# Patient Record
Sex: Female | Born: 1965 | Race: White | Hispanic: No | Marital: Married | Smoking: Former smoker
Health system: Southern US, Community
[De-identification: ages and names within clinical notes are randomized; demographics above are authoritative.]

## PROBLEM LIST (undated history)

## (undated) DIAGNOSIS — Z9109 Other allergy status, other than to drugs and biological substances: Secondary | ICD-10-CM

## (undated) DIAGNOSIS — J40 Bronchitis, not specified as acute or chronic: Secondary | ICD-10-CM

## (undated) DIAGNOSIS — T7840XA Allergy, unspecified, initial encounter: Secondary | ICD-10-CM

## (undated) DIAGNOSIS — D689 Coagulation defect, unspecified: Secondary | ICD-10-CM

## (undated) DIAGNOSIS — C801 Malignant (primary) neoplasm, unspecified: Secondary | ICD-10-CM

## (undated) DIAGNOSIS — M1990 Unspecified osteoarthritis, unspecified site: Secondary | ICD-10-CM

## (undated) HISTORY — PX: OTHER SURGICAL HISTORY: SHX169

## (undated) HISTORY — PX: WISDOM TOOTH EXTRACTION: SHX21

## (undated) HISTORY — DX: Unspecified osteoarthritis, unspecified site: M19.90

## (undated) HISTORY — PX: COLPOSCOPY: SHX161

## (undated) HISTORY — DX: Allergy, unspecified, initial encounter: T78.40XA

## (undated) HISTORY — PX: JOINT REPLACEMENT: SHX530

## (undated) HISTORY — DX: Coagulation defect, unspecified: D68.9

---

## 2004-09-01 ENCOUNTER — Encounter: Admission: RE | Admit: 2004-09-01 | Discharge: 2004-09-01 | Payer: Self-pay | Admitting: Occupational Medicine

## 2007-12-22 ENCOUNTER — Other Ambulatory Visit: Admission: RE | Admit: 2007-12-22 | Discharge: 2007-12-22 | Payer: Self-pay | Admitting: Obstetrics and Gynecology

## 2010-06-04 HISTORY — PX: ABDOMINAL HYSTERECTOMY: SHX81

## 2011-02-26 ENCOUNTER — Other Ambulatory Visit (HOSPITAL_COMMUNITY)
Admission: RE | Admit: 2011-02-26 | Discharge: 2011-02-26 | Disposition: A | Discharge status: Home / Self Care | Payer: Managed Care, Other (non HMO) | Source: Ambulatory Visit | Attending: Obstetrics and Gynecology | Admitting: Obstetrics and Gynecology

## 2011-02-26 DIAGNOSIS — R87810 Cervical high risk human papillomavirus (HPV) DNA test positive: Secondary | ICD-10-CM | POA: Insufficient documentation

## 2011-02-26 DIAGNOSIS — Z01419 Encounter for gynecological examination (general) (routine) without abnormal findings: Secondary | ICD-10-CM | POA: Insufficient documentation

## 2011-05-07 ENCOUNTER — Encounter (HOSPITAL_COMMUNITY): Payer: Self-pay | Admitting: *Deleted

## 2011-05-08 ENCOUNTER — Encounter (HOSPITAL_COMMUNITY): Payer: Self-pay | Admitting: Pharmacist

## 2011-05-13 ENCOUNTER — Other Ambulatory Visit: Payer: Self-pay | Admitting: Obstetrics and Gynecology

## 2011-05-14 ENCOUNTER — Ambulatory Visit (HOSPITAL_COMMUNITY)
Admission: RE | Admit: 2011-05-14 | Discharge: 2011-05-14 | Disposition: A | Discharge status: Home / Self Care | Payer: Managed Care, Other (non HMO) | Source: Ambulatory Visit | Attending: Obstetrics and Gynecology | Admitting: Obstetrics and Gynecology

## 2011-05-14 ENCOUNTER — Encounter (HOSPITAL_COMMUNITY): Payer: Self-pay | Admitting: *Deleted

## 2011-05-14 ENCOUNTER — Encounter (HOSPITAL_COMMUNITY): Payer: Self-pay | Admitting: Anesthesiology

## 2011-05-14 ENCOUNTER — Other Ambulatory Visit: Payer: Self-pay | Admitting: Obstetrics and Gynecology

## 2011-05-14 ENCOUNTER — Encounter (HOSPITAL_COMMUNITY)
Admission: RE | Disposition: A | Discharge status: Home / Self Care | Payer: Self-pay | Source: Ambulatory Visit | Attending: Obstetrics and Gynecology

## 2011-05-14 ENCOUNTER — Ambulatory Visit (HOSPITAL_COMMUNITY): Payer: Managed Care, Other (non HMO) | Admitting: Anesthesiology

## 2011-05-14 DIAGNOSIS — D069 Carcinoma in situ of cervix, unspecified: Secondary | ICD-10-CM | POA: Insufficient documentation

## 2011-05-14 HISTORY — PX: CERVICAL CONIZATION W/BX: SHX1330

## 2011-05-14 HISTORY — DX: Other allergy status, other than to drugs and biological substances: Z91.09

## 2011-05-14 HISTORY — DX: Bronchitis, not specified as acute or chronic: J40

## 2011-05-14 LAB — CBC
Platelets: 389 10*3/uL (ref 150–400)
RBC: 4.49 MIL/uL (ref 3.87–5.11)
RDW: 13.3 % (ref 11.5–15.5)
WBC: 9.6 10*3/uL (ref 4.0–10.5)

## 2011-05-14 LAB — PREGNANCY, URINE: Preg Test, Ur: NEGATIVE

## 2011-05-14 SURGERY — CONE BIOPSY, CERVIX
Anesthesia: General | Site: Vagina | Wound class: Clean Contaminated

## 2011-05-14 MED ORDER — LIDOCAINE HCL (CARDIAC) 20 MG/ML IV SOLN
INTRAVENOUS | Status: DC | PRN
  Administered 2011-05-14: 100 mg via INTRAVENOUS

## 2011-05-14 MED ORDER — FENTANYL CITRATE 0.05 MG/ML IJ SOLN
INTRAMUSCULAR | Status: AC
  Filled 2011-05-14: qty 2

## 2011-05-14 MED ORDER — MICROFIBRILLAR COLL HEMOSTAT EX PADS
MEDICATED_PAD | CUTANEOUS | Status: DC | PRN
  Administered 2011-05-14: 1 via TOPICAL

## 2011-05-14 MED ORDER — FERRIC SUBSULFATE SOLN
Status: DC | PRN
  Administered 2011-05-14: 1 via TOPICAL

## 2011-05-14 MED ORDER — PROPOFOL 10 MG/ML IV EMUL
INTRAVENOUS | Status: AC
  Filled 2011-05-14: qty 20

## 2011-05-14 MED ORDER — ONDANSETRON HCL 4 MG/2ML IJ SOLN
INTRAMUSCULAR | Status: DC | PRN
  Administered 2011-05-14: 4 mg via INTRAVENOUS

## 2011-05-14 MED ORDER — ONDANSETRON HCL 4 MG/2ML IJ SOLN
INTRAMUSCULAR | Status: AC
  Filled 2011-05-14: qty 2

## 2011-05-14 MED ORDER — FENTANYL CITRATE 0.05 MG/ML IJ SOLN: 25.0000 ug | INTRAMUSCULAR | Status: DC | PRN

## 2011-05-14 MED ORDER — LIDOCAINE-EPINEPHRINE 1 %-1:100000 IJ SOLN
INTRAMUSCULAR | Status: DC | PRN
  Administered 2011-05-14: 20 mL

## 2011-05-14 MED ORDER — MIDAZOLAM HCL 5 MG/5ML IJ SOLN
INTRAMUSCULAR | Status: DC | PRN
  Administered 2011-05-14: 2 mg via INTRAVENOUS

## 2011-05-14 MED ORDER — KETOROLAC TROMETHAMINE 30 MG/ML IJ SOLN
INTRAMUSCULAR | Status: DC | PRN
  Administered 2011-05-14: 30 mg via INTRAVENOUS

## 2011-05-14 MED ORDER — LIDOCAINE HCL (CARDIAC) 20 MG/ML IV SOLN
INTRAVENOUS | Status: AC
  Filled 2011-05-14: qty 5

## 2011-05-14 MED ORDER — FENTANYL CITRATE 0.05 MG/ML IJ SOLN
INTRAMUSCULAR | Status: DC | PRN
  Administered 2011-05-14: 25 ug via INTRAVENOUS
  Administered 2011-05-14 (×2): 50 ug via INTRAVENOUS

## 2011-05-14 MED ORDER — MIDAZOLAM HCL 2 MG/2ML IJ SOLN
INTRAMUSCULAR | Status: AC
  Filled 2011-05-14: qty 2

## 2011-05-14 MED ORDER — OXYCODONE-ACETAMINOPHEN 5-325 MG PO TABS: 1.0000 | ORAL_TABLET | Freq: Four times a day (QID) | ORAL | Status: AC | PRN

## 2011-05-14 MED ORDER — DEXAMETHASONE SODIUM PHOSPHATE 10 MG/ML IJ SOLN
INTRAMUSCULAR | Status: DC | PRN
  Administered 2011-05-14 (×2): 10 mg via INTRAVENOUS

## 2011-05-14 MED ORDER — IBUPROFEN 800 MG PO TABS: 800.0000 mg | ORAL_TABLET | Freq: Three times a day (TID) | ORAL | Status: AC | PRN

## 2011-05-14 MED ORDER — DEXAMETHASONE SODIUM PHOSPHATE 10 MG/ML IJ SOLN
INTRAMUSCULAR | Status: AC
  Filled 2011-05-14: qty 1

## 2011-05-14 MED ORDER — EPHEDRINE SULFATE 50 MG/ML IJ SOLN
INTRAMUSCULAR | Status: DC | PRN
  Administered 2011-05-14: 10 mg via INTRAVENOUS

## 2011-05-14 MED ORDER — LUGOLS 5 % PO SOLN
ORAL | Status: DC | PRN
  Administered 2011-05-14: 0.2 mL via ORAL

## 2011-05-14 MED ORDER — LACTATED RINGERS IV SOLN
INTRAVENOUS | Status: DC | PRN
  Administered 2011-05-14 (×2): via INTRAVENOUS
  Administered 2011-05-14: 1000 mL
  Administered 2011-05-14: 09:00:00 via INTRAVENOUS

## 2011-05-14 MED ORDER — EPHEDRINE SULFATE 50 MG/ML IJ SOLN
INTRAMUSCULAR | Status: AC
  Filled 2011-05-14: qty 1

## 2011-05-14 MED ORDER — PROPOFOL 10 MG/ML IV EMUL
INTRAVENOUS | Status: DC | PRN
  Administered 2011-05-14: 200 mg via INTRAVENOUS
  Administered 2011-05-14: 50 mg via INTRAVENOUS

## 2011-05-14 SURGICAL SUPPLY — 29 items
BLADE SURG 11 STRL SS (BLADE) ×2 IMPLANT
CATH ROBINSON RED A/P 16FR (CATHETERS) ×1 IMPLANT
CLOTH BEACON ORANGE TIMEOUT ST (SAFETY) ×2 IMPLANT
CONTAINER PREFILL 10% NBF 60ML (FORM) ×2 IMPLANT
COUNTER NEEDLE 1200 MAGNETIC (NEEDLE) ×2 IMPLANT
DRAPE UNDERBUTTOCKS STRL (DRAPE) ×2 IMPLANT
ELECT BALL LEEP 5MM RED (ELECTRODE) ×2 IMPLANT
ELECT REM PT RETURN 9FT ADLT (ELECTROSURGICAL) ×2
ELECTRODE REM PT RTRN 9FT ADLT (ELECTROSURGICAL) ×1 IMPLANT
GLOVE BIOGEL M 6.5 STRL (GLOVE) ×4 IMPLANT
GLOVE BIOGEL PI IND STRL 6.5 (GLOVE) ×1 IMPLANT
GLOVE BIOGEL PI INDICATOR 6.5 (GLOVE) ×1
GOWN PREVENTION PLUS LG XLONG (DISPOSABLE) ×2 IMPLANT
GOWN PREVENTION PLUS XLARGE (GOWN DISPOSABLE) ×2 IMPLANT
NDL SPNL 22GX3.5 QUINCKE BK (NEEDLE) ×1 IMPLANT
NEEDLE SPNL 22GX3.5 QUINCKE BK (NEEDLE) ×2 IMPLANT
NS IRRIG 1000ML POUR BTL (IV SOLUTION) ×2 IMPLANT
PACK VAGINAL MINOR WOMEN LF (CUSTOM PROCEDURE TRAY) ×2 IMPLANT
PENCIL BUTTON HOLSTER BLD 10FT (ELECTRODE) ×2 IMPLANT
SCOPETTES 8  STERILE (MISCELLANEOUS) ×2
SCOPETTES 8 STERILE (MISCELLANEOUS) ×2 IMPLANT
SPONGE SURGIFOAM ABS GEL 12-7 (HEMOSTASIS) IMPLANT
SUT VIC AB 0 CT1 27 (SUTURE) ×6
SUT VIC AB 0 CT1 27XBRD ANBCTR (SUTURE) ×3 IMPLANT
SYR CONTROL 10ML LL (SYRINGE) ×2 IMPLANT
TOWEL OR 17X24 6PK STRL BLUE (TOWEL DISPOSABLE) ×4 IMPLANT
TUBING CONNECTING 10 (TUBING) ×4 IMPLANT
WATER STERILE IRR 1000ML POUR (IV SOLUTION) ×2 IMPLANT
YANKAUER SUCT BULB TIP NO VENT (SUCTIONS) ×3 IMPLANT

## 2011-05-14 NOTE — Transfer of Care (Signed)
Immediate Anesthesia Transfer of Care Note  Patient: Cindy Boyer  Procedure(s) Performed:  CONIZATION CERVIX WITH BIOPSY  Patient Location: PACU  Anesthesia Type: General  Level of Consciousness: awake, alert  and oriented  Airway & Oxygen Therapy: Patient Spontanous Breathing and Patient connected to nasal cannula oxygen  Post-op Assessment: Report given to PACU RN  Post vital signs: Reviewed and stable  Complications: No apparent anesthesia complications

## 2011-05-14 NOTE — Anesthesia Procedure Notes (Signed)
Procedure Name: LMA Insertion Performed by: Branton Einstein, [employer]

## 2011-05-14 NOTE — H&P (Signed)
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   PMH Seasonal allergies PGYN hx hPV CIN III POB hx G1P1 SVD 1991 MEds flonase claritin  Occasional ibuprofen  Allergies Penicillin  rx skin rash / itching PSH None   Social married. +tobacco 1 1/2 ppd... NO etoh no illicit drug use.  ROS negative except as stated in HPI  VS AFF VSS CV rrr  Lungs clear  abd soft nontender  Ext no clubbin cynosis or edema Pelvic  Normal external genitalia. No adnexal masses. Normal size uterus  NO CMT  A/P CIN III  For Cold knife conization. R/b/a of surgery discussed with surgery including but not limited to infections / bleeding / damage to uterus/ perforation with the need for further surgeryl . Pt voiced understanding and desires to proceed.

## 2011-05-14 NOTE — Op Note (Signed)
05/14/2011  10:12 AM  PATIENT:  Cindy Boyer  45 y.o. female  PRE-OPERATIVE DIAGNOSIS:  Cervical Intraepithelial Neoplasia class III  POST-OPERATIVE DIAGNOSIS:  Cervical Intraepithelial Neoplasia Class III  PROCEDURE:  Procedure(s): CONIZATION CERVIX WITH BIOPSY  SURGEON:  Surgeon(s): Dorien Chihuahua. Terek Bee  PHYSICIAN ASSISTANT: None   ASSISTANTS: none   ANESTHESIA:   general  EBL:  Total I/O In: 1000 [I.V.:1000] Out: 25 [Other:25]  BLOOD ADMINISTERED:none  DRAINS: none   LOCAL MEDICATIONS USED:  LIDOCAINE 20 CC with 1:100 of epinephrine   SPECIMEN:  Source of Specimen:  cervical cone   DISPOSITION OF SPECIMEN:  PATHOLOGY  COUNTS:  YES  TOURNIQUET:  * No tourniquets in log *  DICTATION: .Dragon Dictation  PLAN OF CARE: Discharge to home after PACU  PATIENT DISPOSITION:  PACU - hemodynamically stable.   Delay start of Pharmacological VTE agent (>24hrs) due to surgical blood loss or risk of bleeding:  {YES/NO/NOT APPLICABLE:20182  Procedure:  Patient was taken to the operating room placed under general anesthesia.  She was placed in the dorsal lithotomy position prepped and draped in the usual sterile fashion. A  weighted speculum was placed into the vaginal vault the cervix grasped with a single-tooth tenaculum. 10 cc of 1% lidocaine with 1 :100 epi were injected at the 4 and 8 o clock positions. 0 Vicryl was placed at the 3 and 9:00 position.Lugols was applied to the cervix.  A scalpel was used to excise a cone  portion of the cervix. The specimen was tagged at the 12:00 position. Hemostasis was obtained with Monsel. Surgicel was placed into the excision site. All instruments were were removed. The patient was taken to the recovery room awake and in stable condition. Sponge lap needle counts were correct x2.

## 2011-05-14 NOTE — Anesthesia Postprocedure Evaluation (Signed)
  Anesthesia Post-op Note  Patient: Cindy Boyer  Procedure(s) Performed:  CONIZATION CERVIX WITH BIOPSY  Patient is awake and responsive. Pain and nausea are reasonably well controlled. Vital signs are stable and clinically acceptable. Oxygen saturation is clinically acceptable. There are no apparent anesthetic complications at this time. Patient is ready for discharge.

## 2011-05-14 NOTE — Anesthesia Preprocedure Evaluation (Addendum)
Anesthesia Evaluation  Patient identified by MRN, date of birth, ID band Patient awake    Reviewed: Allergy & Precautions, H&P , Patient's Chart, lab work & pertinent test results  Airway Mallampati: II TM Distance: >3 FB Neck ROM: full    Dental No notable dental hx.    Pulmonary  clear to auscultation  Pulmonary exam normal       Cardiovascular Exercise Tolerance: Good regular Normal    Neuro/Psych    GI/Hepatic   Endo/Other    Renal/GU      Musculoskeletal   Abdominal   Peds  Hematology   Anesthesia Other Findings   Reproductive/Obstetrics                           Anesthesia Physical Anesthesia Plan  ASA: II  Anesthesia Plan: General   Post-op Pain Management:    Induction: Intravenous  Airway Management Planned: LMA  Additional Equipment:   Intra-op Plan:   Post-operative Plan:   Informed Consent: I have reviewed the patients History and Physical, chart, labs and discussed the procedure including the risks, benefits and alternatives for the proposed anesthesia with the patient or authorized representative who has indicated his/her understanding and acceptance.   Dental Advisory Given  Plan Discussed with: CRNA and Surgeon  Anesthesia Plan Comments: (  Discussed  general anesthesia, including possible nausea, instrumentation of airway, sore throat,pulmonary aspiration, etc. I asked if the were any outstanding questions, or  concerns before we proceeded. )       Anesthesia Quick Evaluation

## 2011-05-15 ENCOUNTER — Encounter (HOSPITAL_COMMUNITY): Payer: Self-pay | Admitting: Obstetrics and Gynecology

## 2013-10-09 ENCOUNTER — Other Ambulatory Visit: Payer: Self-pay | Admitting: Obstetrics and Gynecology

## 2013-10-09 ENCOUNTER — Other Ambulatory Visit (HOSPITAL_COMMUNITY)
Admission: RE | Admit: 2013-10-09 | Discharge: 2013-10-09 | Disposition: A | Discharge status: Home / Self Care | Payer: Managed Care, Other (non HMO) | Source: Ambulatory Visit | Attending: Obstetrics and Gynecology | Admitting: Obstetrics and Gynecology

## 2013-10-09 DIAGNOSIS — Z1151 Encounter for screening for human papillomavirus (HPV): Secondary | ICD-10-CM | POA: Insufficient documentation

## 2013-10-09 DIAGNOSIS — Z01419 Encounter for gynecological examination (general) (routine) without abnormal findings: Secondary | ICD-10-CM | POA: Insufficient documentation

## 2014-10-15 ENCOUNTER — Other Ambulatory Visit: Payer: Self-pay | Admitting: Family Medicine

## 2014-10-15 DIAGNOSIS — R9389 Abnormal findings on diagnostic imaging of other specified body structures: Secondary | ICD-10-CM

## 2014-10-19 ENCOUNTER — Ambulatory Visit
Admission: RE | Admit: 2014-10-19 | Discharge: 2014-10-19 | Disposition: A | Discharge status: Home / Self Care | Payer: Managed Care, Other (non HMO) | Source: Ambulatory Visit | Attending: Family Medicine | Admitting: Family Medicine

## 2014-10-19 DIAGNOSIS — R9389 Abnormal findings on diagnostic imaging of other specified body structures: Secondary | ICD-10-CM

## 2014-10-19 MED ORDER — IOHEXOL 300 MG/ML  SOLN
75.0000 mL | Freq: Once | INTRAMUSCULAR | Status: AC | PRN
  Administered 2014-10-19: 75 mL via INTRAVENOUS

## 2016-06-04 HISTORY — PX: BREAST BIOPSY: SHX20

## 2016-10-26 ENCOUNTER — Other Ambulatory Visit: Payer: Self-pay | Admitting: Physician Assistant

## 2016-10-26 DIAGNOSIS — Z803 Family history of malignant neoplasm of breast: Secondary | ICD-10-CM

## 2016-10-26 DIAGNOSIS — Z1231 Encounter for screening mammogram for malignant neoplasm of breast: Secondary | ICD-10-CM

## 2017-02-05 ENCOUNTER — Ambulatory Visit
Admission: RE | Admit: 2017-02-05 | Discharge: 2017-02-05 | Disposition: A | Discharge status: Home / Self Care | Payer: Managed Care, Other (non HMO) | Source: Ambulatory Visit | Attending: Physician Assistant | Admitting: Physician Assistant

## 2017-02-05 DIAGNOSIS — Z803 Family history of malignant neoplasm of breast: Secondary | ICD-10-CM

## 2017-02-05 DIAGNOSIS — Z1231 Encounter for screening mammogram for malignant neoplasm of breast: Secondary | ICD-10-CM

## 2017-02-06 ENCOUNTER — Other Ambulatory Visit: Payer: Self-pay | Admitting: Physician Assistant

## 2017-02-06 DIAGNOSIS — R928 Other abnormal and inconclusive findings on diagnostic imaging of breast: Secondary | ICD-10-CM

## 2017-02-22 ENCOUNTER — Ambulatory Visit
Admission: RE | Admit: 2017-02-22 | Discharge: 2017-02-22 | Disposition: A | Discharge status: Home / Self Care | Payer: Managed Care, Other (non HMO) | Source: Ambulatory Visit | Attending: Physician Assistant | Admitting: Physician Assistant

## 2017-02-22 ENCOUNTER — Other Ambulatory Visit: Payer: Self-pay | Admitting: Physician Assistant

## 2017-02-22 DIAGNOSIS — R928 Other abnormal and inconclusive findings on diagnostic imaging of breast: Secondary | ICD-10-CM

## 2017-02-22 DIAGNOSIS — N6320 Unspecified lump in the left breast, unspecified quadrant: Secondary | ICD-10-CM

## 2017-02-22 HISTORY — DX: Malignant (primary) neoplasm, unspecified: C80.1

## 2017-03-04 ENCOUNTER — Other Ambulatory Visit: Payer: Self-pay | Admitting: Physician Assistant

## 2017-03-04 DIAGNOSIS — N6320 Unspecified lump in the left breast, unspecified quadrant: Secondary | ICD-10-CM

## 2017-03-05 ENCOUNTER — Ambulatory Visit
Admission: RE | Admit: 2017-03-05 | Discharge: 2017-03-05 | Disposition: A | Discharge status: Home / Self Care | Payer: Managed Care, Other (non HMO) | Source: Ambulatory Visit | Attending: Physician Assistant | Admitting: Physician Assistant

## 2017-03-05 ENCOUNTER — Other Ambulatory Visit: Payer: Self-pay | Admitting: Physician Assistant

## 2017-03-05 DIAGNOSIS — N6320 Unspecified lump in the left breast, unspecified quadrant: Secondary | ICD-10-CM

## 2018-05-16 ENCOUNTER — Ambulatory Visit (INDEPENDENT_AMBULATORY_CARE_PROVIDER_SITE_OTHER): Payer: Managed Care, Other (non HMO) | Admitting: Family Medicine

## 2018-05-16 ENCOUNTER — Other Ambulatory Visit (HOSPITAL_COMMUNITY)
Admission: RE | Admit: 2018-05-16 | Discharge: 2018-05-16 | Disposition: A | Discharge status: Home / Self Care | Payer: Managed Care, Other (non HMO) | Source: Ambulatory Visit | Attending: Family Medicine | Admitting: Family Medicine

## 2018-05-16 ENCOUNTER — Encounter: Payer: Self-pay | Admitting: Family Medicine

## 2018-05-16 VITALS — BP 140/90 | HR 89 | Temp 98.0°F | Ht 67.0 in | Wt 225.8 lb

## 2018-05-16 DIAGNOSIS — Z0000 Encounter for general adult medical examination without abnormal findings: Secondary | ICD-10-CM | POA: Diagnosis not present

## 2018-05-16 DIAGNOSIS — I10 Essential (primary) hypertension: Secondary | ICD-10-CM

## 2018-05-16 DIAGNOSIS — Z90711 Acquired absence of uterus with remaining cervical stump: Secondary | ICD-10-CM | POA: Diagnosis present

## 2018-05-16 DIAGNOSIS — Z2821 Immunization not carried out because of patient refusal: Secondary | ICD-10-CM

## 2018-05-16 DIAGNOSIS — Z7689 Persons encountering health services in other specified circumstances: Secondary | ICD-10-CM

## 2018-05-16 DIAGNOSIS — E538 Deficiency of other specified B group vitamins: Secondary | ICD-10-CM | POA: Diagnosis not present

## 2018-05-16 DIAGNOSIS — Z8541 Personal history of malignant neoplasm of cervix uteri: Secondary | ICD-10-CM | POA: Diagnosis not present

## 2018-05-16 LAB — LIPID PANEL
CHOL/HDL RATIO: 3
Cholesterol: 207 mg/dL — ABNORMAL HIGH (ref 0–200)
HDL: 78.5 mg/dL (ref >39.00)
LDL CALC: 110 mg/dL — AB (ref 0–99)
NONHDL: 128.79
Triglycerides: 92 mg/dL (ref 0.0–149.0)
VLDL: 18.4 mg/dL (ref 0.0–40.0)

## 2018-05-16 LAB — BASIC METABOLIC PANEL
BUN: 21 mg/dL (ref 6–23)
CHLORIDE: 105 meq/L (ref 96–112)
CO2: 24 mEq/L (ref 19–32)
Calcium: 9.2 mg/dL (ref 8.4–10.5)
Creatinine, Ser: 0.88 mg/dL (ref 0.40–1.20)
GFR: 71.6 mL/min (ref >60.00)
Glucose, Bld: 108 mg/dL — ABNORMAL HIGH (ref 70–99)
Potassium: 4.4 mEq/L (ref 3.5–5.1)
Sodium: 139 mEq/L (ref 135–145)

## 2018-05-16 LAB — VITAMIN B12: Vitamin B-12: 1084 pg/mL — ABNORMAL HIGH (ref 211–911)

## 2018-05-16 LAB — AST: AST: 18 U/L (ref 0–37)

## 2018-05-16 LAB — ALT: ALT: 13 U/L (ref 0–35)

## 2018-05-16 LAB — VITAMIN D 25 HYDROXY (VIT D DEFICIENCY, FRACTURES): VITD: 24.82 ng/mL — ABNORMAL LOW (ref 30.00–100.00)

## 2018-05-16 MED ORDER — LISINOPRIL 20 MG PO TABS: 20.0000 mg | ORAL_TABLET | Freq: Every day | ORAL | 3 refills | Status: DC

## 2018-05-16 NOTE — Progress Notes (Signed)
Cindy Boyer is a 52 y.o. female  Chief Complaint  Patient presents with  . Establish Care    pt would like to establish care and get physical. due for mamm and pap. will give pt cologuard info never had a colonoscopy    HPI: Cindy Boyer is a 52 y.o. female here as a new pt to our office to establish care.  She needs a refill of her BP med which she has been out of for about 1 month.  She works at [employer] in [employer], married with 2 children, 3 granddaughters, 1 great granddaughter.   Specialists: none  Last CPE, labs: due  Last PAP: Pt is s/p partial hysterectomy d/t cervical cancer and overdue for PAP Last mammo: due Last Dexa: n/a Last colonoscopy: never; pt is agreeable to cologuard  Med refills needed today: BP med   Past Medical History:  Diagnosis Date  . Bronchitis    HISTORY AS CHILD, NO PROBLEMS AS ADULT   . Cancer ([place])    Cervical  . Environmental allergies     Past Surgical History:  Procedure Laterality Date  . ABDOMINAL HYSTERECTOMY  2012  . BREAST BIOPSY  2018  . CERVICAL CONIZATION W/BX  05/14/2011   Procedure: CONIZATION CERVIX WITH BIOPSY;  Surgeon: Catha Brow;  Location: [facility];  Service: Gynecology;  Laterality: N/A;  . COLPOSCOPY    . SVD     X 1  . WISDOM TOOTH EXTRACTION      Social History   Socioeconomic History  . Marital status: Married    Spouse name: Not on file  . Number of children: Not on file  . Years of education: Not on file  . Highest education level: Not on file  Occupational History  . Not on file  Social Needs  . Financial resource strain: Not on file  . Food insecurity:    Worry: Not on file    Inability: Not on file  . Transportation needs:    Medical: Not on file    Non-medical: Not on file  Tobacco Use  . Smoking status: Former Smoker    Packs/day: 1.00    Years: 26.00    Pack years: 26.00    Types: Cigarettes    Last attempt to quit: 08/20/2017    Years since quitting: 0.7  . Smokeless  tobacco: Never Used  Substance and Sexual Activity  . Alcohol use: No  . Drug use: No  . Sexual activity: Yes    Birth control/protection: None    Comment: HUSBAND VASECTOMY  Lifestyle  . Physical activity:    Days per week: Not on file    Minutes per session: Not on file  . Stress: Not on file  Relationships  . Social connections:    Talks on phone: Not on file    Gets together: Not on file    Attends religious service: Not on file    Active member of club or organization: Not on file    Attends meetings of clubs or organizations: Not on file    Relationship status: Not on file  . Intimate partner violence:    Fear of current or ex partner: Not on file    Emotionally abused: Not on file    Physically abused: Not on file    Forced sexual activity: Not on file  Other Topics Concern  . 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     Family History  Problem Relation Age of Onset  . Breast cancer Mother   . Arthritis Mother   . Depression Father   . Alcohol abuse Father   . Depression Brother   . Arthritis Maternal Grandmother   . Arthritis Brother       There is no immunization history on file for this patient.  Outpatient Encounter Medications as of 05/16/2018  Medication Sig  . calcium carbonate (TUMS - DOSED IN MG ELEMENTAL CALCIUM) 500 MG chewable tablet Chew 1 tablet by mouth daily.  Marland Kitchen lisinopril (PRINIVIL,ZESTRIL) 20 MG tablet Take 1 tablet (20 mg total) by mouth daily.  . Multiple Vitamin (MULTIVITAMIN) capsule Take 1 capsule by mouth daily.  . vitamin B-12 (CYANOCOBALAMIN) 1000 MCG tablet Take 1,000 mcg by mouth daily.  . [DISCONTINUED] lisinopril (PRINIVIL,ZESTRIL) 20 MG tablet Take 20 mg by mouth daily.  . fluticasone (FLONASE) 50 MCG/ACT nasal spray Place 1 spray into the nose as needed.    . loratadine (CLARITIN) 10 MG tablet Take 10 mg by mouth daily.      No facility-administered encounter medications on file as of 05/16/2018.      ROS: Gen: no fever, chills  Skin: no rash, itching ENT: no ear pain, ear drainage, nasal congestion, rhinorrhea, sinus pressure, sore throat Resp: no cough, wheeze,SOB CV: no CP, palpitations, LE edema,  GI: no heartburn, n/v/d/c, abd pain GU: no dysuria, urgency, frequency, hematuria  MSK: + B/L knee pain - chronic d/t arthritis; no myalgias, back pain Neuro: no dizziness, headache, weakness, vertigo Psych: no depression, anxiety, insomnia   Allergies  Allergen Reactions  . Penicillins Itching, Swelling and Rash    BP 140/90 (BP Location: Left Arm, Patient Position: Sitting, Cuff Size: Normal)   Pulse 89   Temp 98 F (36.7 C) (Oral)   Ht 5\' 7"  (1.702 m)   Wt 225 lb 12.8 oz (102.4 kg)   LMP 03/21/2011   SpO2 97%   BMI 35.37 kg/m   Physical Exam  Constitutional: She is oriented to person, place, and time. She appears well-developed and well-nourished. No distress.  HENT:  Head: Normocephalic and atraumatic.  Right Ear: Tympanic membrane, external ear and ear canal normal.  Left Ear: Tympanic membrane, external ear and ear canal normal.  Nose: Nose normal. Right sinus exhibits no maxillary sinus tenderness and no frontal sinus tenderness. Left sinus exhibits no maxillary sinus tenderness and no frontal sinus tenderness.  Mouth/Throat: Oropharynx is clear and moist and mucous membranes are normal.  Eyes: Pupils are equal, round, and reactive to light. Conjunctivae are normal.  Neck: Neck supple. No JVD present. No thyromegaly present.  Cardiovascular: Normal rate, regular rhythm and intact distal pulses.  No murmur heard. Pulmonary/Chest: Effort normal and breath sounds normal. She has no wheezes. She has no rhonchi. No breast swelling, tenderness or discharge.  Abdominal: Soft. Bowel sounds are normal. She exhibits no distension and no mass. There is no abdominal tenderness.  Genitourinary:     Vagina normal.  There is no lesion on the right labia. There is no lesion on the left labia.    No vaginal discharge.   Musculoskeletal: Normal range of motion.        General: No edema.  Lymphadenopathy:    She has no cervical adenopathy.  Neurological: She is alert and oriented to person, place, and time.  Skin: Skin is warm and dry.  Psychiatric: She  has a normal mood and affect. Her behavior is normal.     A/P:  1. Encounter to establish care with new doctor  2. History of cervical cancer 3. History of partial hysterectomy - Cytology - PAP( [place])  4. Annual physical exam - encouraged pt to establish regular exercise routine and work to improve diet - UTD on dental and vision exams - declines flu vaccine - cologuard info give, pt to schedule mammo - ALT - AST - Basic metabolic panel - Lipid panel - Cytology - PAP( [place]) - VITAMIN D 25 Hydroxy (Vit-D Deficiency, Fractures) - next CPE in 1 year  5. Essential hypertension - off med x 1 mo Refill: - lisinopril (PRINIVIL,ZESTRIL) 20 MG tablet; Take 1 tablet (20 mg total) by mouth daily.  Dispense: 90 tablet; Refill: 3 - pt to check BP 3x/wk and send readings via MyChart in 2 wks  6. B12 deficiency - cont oral B12 supplement - Vitamin B12

## 2018-05-22 LAB — CYTOLOGY - PAP: HPV: NOT DETECTED

## 2018-09-04 ENCOUNTER — Ambulatory Visit (INDEPENDENT_AMBULATORY_CARE_PROVIDER_SITE_OTHER): Payer: Managed Care, Other (non HMO) | Admitting: Family Medicine

## 2018-09-04 ENCOUNTER — Encounter: Payer: Self-pay | Admitting: Family Medicine

## 2018-09-04 VITALS — Wt 224.6 lb

## 2018-09-04 DIAGNOSIS — J069 Acute upper respiratory infection, unspecified: Secondary | ICD-10-CM

## 2018-09-04 NOTE — Progress Notes (Signed)
Virtual Visit via Video Note  I connected with Cindy Boyer on 09/04/18 at  1:00 PM EDT by a video enabled telemedicine application and verified that I am speaking with the correct person using two identifiers. Location patient: home Location provider: work  Persons participating in the virtual visit: patient, provider  I discussed the limitations of evaluation and management by telemedicine and the availability of in person appointments. The patient expressed understanding and agreed to proceed.  Chief Complaint  Patient presents with  . Cough    pt. reports a cough x 2 days; she loss her voice on this past Tuesday prior to cough; pt. reports no sore throat, fever or any other symptoms     HPI: Cindy Boyer is a 53 y.o. female complains of 2 day h/o hoarse voice and cough. She states cough is "not terrible". Cough is more productive today compared to yesterday.  No fever, chills. No sore throat, runny nose, nasal congestion. No SOB. No body aches. No increased fatigue. No rash. No abd pain, n/v/d/c.  No recent travel.    She has seasonal allergies.  Husband had similar symptoms about 5-6 days ago, resolved now. Pt has been gargling and drinking tea to help with her symptoms.    Past Medical History:  Diagnosis Date  . Bronchitis    HISTORY AS CHILD, NO PROBLEMS AS ADULT   . Cancer ([place])    Cervical  . Environmental allergies     Past Surgical History:  Procedure Laterality Date  . ABDOMINAL HYSTERECTOMY  2012  . BREAST BIOPSY  2018  . CERVICAL CONIZATION W/BX  05/14/2011   Procedure: CONIZATION CERVIX WITH BIOPSY;  Surgeon: Catha Brow;  Location: [facility];  Service: Gynecology;  Laterality: N/A;  . COLPOSCOPY    . SVD     X 1  . WISDOM TOOTH EXTRACTION      Family History  Problem Relation Age of Onset  . Breast cancer Mother   . Arthritis Mother   . Depression Father   . Alcohol abuse Father   . Depression Brother   . Arthritis Maternal Grandmother   .  Arthritis Brother     Social History   Tobacco Use  . Smoking status: Former Smoker    Packs/day: 1.00    Years: 26.00    Pack years: 26.00    Types: Cigarettes    Last attempt to quit: 08/20/2017    Years since quitting: 1.0  . Smokeless tobacco: Never Used  Substance Use Topics  . Alcohol use: No  . Drug use: No     Current Outpatient Medications:  .  calcium carbonate (TUMS - DOSED IN MG ELEMENTAL CALCIUM) 500 MG chewable tablet, Chew 1 tablet by mouth daily., Disp: , Rfl:  .  cetirizine (ZYRTEC) 10 MG tablet, Take 10 mg by mouth daily., Disp: , Rfl:  .  Cholecalciferol (VITAMIN D) 50 MCG (2000 UT) CAPS, Take 1 capsule by mouth daily., Disp: , Rfl:  .  lisinopril (PRINIVIL,ZESTRIL) 20 MG tablet, Take 1 tablet (20 mg total) by mouth daily., Disp: 90 tablet, Rfl: 3 .  Multiple Vitamin (MULTIVITAMIN) capsule, Take 1 capsule by mouth daily., Disp: , Rfl:   Allergies  Allergen Reactions  . Penicillins Itching, Swelling and Rash      ROS: See pertinent positives and negatives per HPI.   EXAM:  VITALS per patient if applicable:  GENERAL: alert, oriented, appears well and in no acute distress  HEENT: atraumatic, conjunctiva  clear, no obvious abnormalities on inspection of external nose and ears  NECK: normal movements of the head and neck  LUNGS: on inspection no signs of respiratory distress, breathing rate appears normal, no obvious gross SOB, gasping or wheezing, no conversational dyspnea  CV: no obvious cyanosis  MS: moves all visible extremities without noticeable abnormality  PSYCH/NEURO: pleasant and cooperative, no obvious depression or anxiety, speech and thought processing grossly intact   ASSESSMENT AND PLAN:  1. Viral upper respiratory tract infection - cont supportive care to include increased fluids, rest, tylenol PRN - can add Mucinex BID, salt water gargles, robitussin cough syrup PRN - f/u if symptoms worsen or do not improve in 7-10 days - pt  does not need work note   I discussed the assessment and treatment plan with the patient. The patient was provided an opportunity to ask questions and all were answered. The patient agreed with the plan and demonstrated an understanding of the instructions.   The patient was advised to call back or seek an in-person evaluation if the symptoms worsen or if the condition fails to improve as anticipated.    Letta Median, DO

## 2019-05-01 ENCOUNTER — Other Ambulatory Visit: Payer: Self-pay | Admitting: Family Medicine

## 2019-05-01 DIAGNOSIS — I10 Essential (primary) hypertension: Secondary | ICD-10-CM

## 2019-05-12 ENCOUNTER — Ambulatory Visit: Payer: Managed Care, Other (non HMO) | Admitting: Family Medicine

## 2019-05-12 ENCOUNTER — Other Ambulatory Visit: Payer: Self-pay

## 2019-05-12 ENCOUNTER — Encounter: Payer: Self-pay | Admitting: Family Medicine

## 2019-05-12 ENCOUNTER — Ambulatory Visit (INDEPENDENT_AMBULATORY_CARE_PROVIDER_SITE_OTHER): Payer: Managed Care, Other (non HMO)

## 2019-05-12 VITALS — BP 122/80 | HR 100 | Temp 96.1°F | Ht 67.0 in | Wt 225.0 lb

## 2019-05-12 DIAGNOSIS — M25562 Pain in left knee: Secondary | ICD-10-CM | POA: Insufficient documentation

## 2019-05-12 MED ORDER — DICLOFENAC SODIUM 1 % EX GEL: 4.0000 g | Freq: Four times a day (QID) | CUTANEOUS | 0 refills | Status: DC

## 2019-05-12 NOTE — Patient Instructions (Signed)
We'll give you a call with xray results.  Try the voltaren gel over area that is painful. You can use this 4 times per day.

## 2019-05-12 NOTE — Assessment & Plan Note (Signed)
Seems to have OA of the knee at baseline, likely exacerbated by recent fall.  Xrays of knee ordered today.  She will try diclofenac gel to area with daily icing.  Discussed possibility of degenerative meniscal tear and if not improving we can refer for sports med evaluation.

## 2019-05-12 NOTE — Progress Notes (Signed)
Cindy Boyer - 53 y.o. female MRN [id number]  Date of birth: [birth year]  Subjective Chief Complaint  Patient presents with  . Lt knee pain    Left knee pain has been getting progressivly worse, she fell 2 weeks ago and seemed to be getting better but now she said the muscle beside her knee is hurting x 1week ago    HPI Cindy Boyer is a 53 y.o. female here today with complaint of L knee pain.  She reports that she fell at work on 04/28/2019.  She said she lost her footing and fell backwards.  She did not hit her knee in the process of falling.  She denies any obstacles or wet floors contributing to her fall. She declined medical evaluation initially for this injury.  She thought things were improving but over the past week she has had worsening pain.  Pain located over lateral knee with radiation into the side of the leg.  She denies numbness or tingling.  She denies weakness but knee does feel unstable sometimes.  She has not noticed a whole lot of swelling and denies bruising of the knee.   ROS:  A comprehensive ROS was completed and negative except as noted per HPI  Allergies  Allergen Reactions  . Penicillins Itching, Swelling and Rash    Past Medical History:  Diagnosis Date  . Bronchitis    HISTORY AS CHILD, NO PROBLEMS AS ADULT   . Cancer ([place])    Cervical  . Environmental allergies     Past Surgical History:  Procedure Laterality Date  . ABDOMINAL HYSTERECTOMY  2012  . BREAST BIOPSY  2018  . CERVICAL CONIZATION W/BX  05/14/2011   Procedure: CONIZATION CERVIX WITH BIOPSY;  Surgeon: Catha Brow;  Location: [facility];  Service: Gynecology;  Laterality: N/A;  . COLPOSCOPY    . SVD     X 1  . WISDOM TOOTH EXTRACTION      Social History   Socioeconomic History  . Marital status: Married    Spouse name: Not on file  . Number of children: Not on file  . Years of education: Not on file  . Highest education level: Not on file  Occupational History  . Not on file   Social Needs  . Financial resource strain: Not on file  . Food insecurity    Worry: Not on file    Inability: Not on file  . Transportation needs    Medical: Not on file    Non-medical: Not on file  Tobacco Use  . Smoking status: Former Smoker    Packs/day: 1.00    Years: 26.00    Pack years: 26.00    Types: Cigarettes    Quit date: 08/20/2017    Years since quitting: 1.7  . Smokeless tobacco: Never Used  Substance and Sexual Activity  . Alcohol use: No  . Drug use: No  . Sexual activity: Yes    Birth control/protection: None    Comment: HUSBAND VASECTOMY  Lifestyle  . Physical activity    Days per week: Not on file    Minutes per session: Not on file  . Stress: Not on file  Relationships  . Social Herbalist on phone: Not on file    Gets together: Not on file    Attends religious service: Not on file    Active member of club or organization: Not on file    Attends meetings of clubs or organizations: Not  on file    Relationship status: Not on file  Other Topics Concern  . Not on file  Social History Narrative   She works at [employer] in [employer], married with 2 children, 3 granddaughters, 1 great granddaughter.     Family History  Problem Relation Age of Onset  . Breast cancer Mother   . Arthritis Mother   . Depression Father   . Alcohol abuse Father   . Depression Brother   . Arthritis Maternal Grandmother   . Arthritis Brother     Health Maintenance  Topic Date Due  . HIV Screening  12/17/1980  . COLONOSCOPY  12/18/2015  . INFLUENZA VACCINE  01/03/2019  . MAMMOGRAM  02/06/2019  . PAP SMEAR-Modifier  05/16/2021  . TETANUS/TDAP  11/04/2024    ----------------------------------------------------------------------------------------------------------------------------------------------------------------------------------------------------------------- Physical Exam BP 122/80 (BP Location: Left Arm, Patient Position: Sitting, Cuff Size:  Normal)   Pulse 100   Temp (!) 96.1 F (35.6 C) (Temporal)   Ht 5\' 7"  (1.702 m)   Wt 225 lb (102.1 kg)   LMP 03/21/2011   SpO2 97%   BMI 35.24 kg/m   Physical Exam Constitutional:      Appearance: Normal appearance.  HENT:     Head: Normocephalic and atraumatic.  Musculoskeletal:     Comments: L knee normal to inspection without effusion.  She has significant crepitus and mild pain on flexion and extension of the knee.  She is unable to fully extend the knee.  She has mild ttp over the fibular head.  No significant ligament laxity noted.  Mc[place]ray with mild pain, no catching.   Mild pain with thesaly testing but localizes more over lateral leg.    Skin:    General: Skin is warm and dry.  Neurological:     General: No focal deficit present.     Mental Status: She is alert.  Psychiatric:        Mood and Affect: Mood normal.        Behavior: Behavior normal.     ------------------------------------------------------------------------------------------------------------------------------------------------------------------------------------------------------------------- Assessment and Plan  Acute pain of left knee Seems to have OA of the knee at baseline, likely exacerbated by recent fall.  Xrays of knee ordered today.  She will try diclofenac gel to area with daily icing.  Discussed possibility of degenerative meniscal tear and if not improving we can refer for sports med evaluation.    This visit occurred during the SARS-CoV-2 public health emergency.  Safety protocols were in place, including screening questions prior to the visit, additional usage of staff PPE, and extensive cleaning of exam room while observing appropriate contact time as indicated for disinfecting solutions.

## 2019-05-13 ENCOUNTER — Encounter: Payer: Self-pay | Admitting: Family Medicine

## 2019-05-18 ENCOUNTER — Other Ambulatory Visit: Payer: Self-pay

## 2019-05-19 ENCOUNTER — Ambulatory Visit (INDEPENDENT_AMBULATORY_CARE_PROVIDER_SITE_OTHER): Payer: Managed Care, Other (non HMO) | Admitting: Family Medicine

## 2019-05-19 ENCOUNTER — Encounter: Payer: Self-pay | Admitting: Family Medicine

## 2019-05-19 DIAGNOSIS — M25562 Pain in left knee: Secondary | ICD-10-CM

## 2019-05-19 NOTE — Progress Notes (Signed)
Cindy Boyer - 53 y.o. female MRN [id number]  Date of birth: 13-May-1966  Subjective Chief Complaint  Patient presents with  . Injections    Knee injection. Pt has knee pain.    HPI Cindy Boyer is a 53 y.o. female here today for follow up of knee pain.  Previous xray showed significant arthritis and she would like injection of the knee.  She also continues to have lateral pain of the knee and would like to see orthopedics for this.  She has not had increased swelling recently.   ROS:  A comprehensive ROS was completed and negative except as noted per HPI  Allergies  Allergen Reactions  . Penicillins Itching, Swelling and Rash    Past Medical History:  Diagnosis Date  . Bronchitis    HISTORY AS CHILD, NO PROBLEMS AS ADULT   . Cancer ([place])    Cervical  . Environmental allergies     Past Surgical History:  Procedure Laterality Date  . ABDOMINAL HYSTERECTOMY  2012  . BREAST BIOPSY  2018  . CERVICAL CONIZATION W/BX  05/14/2011   Procedure: CONIZATION CERVIX WITH BIOPSY;  Surgeon: Catha Brow;  Location: [facility];  Service: Gynecology;  Laterality: N/A;  . COLPOSCOPY    . SVD     X 1  . WISDOM TOOTH EXTRACTION      Social History   Socioeconomic History  . Marital status: Married    Spouse name: Not on file  . Number of children: Not on file  . Years of education: Not on file  . Highest education level: Not on file  Occupational History  . Not on file  Tobacco Use  . Smoking status: Former Smoker    Packs/day: 1.00    Years: 26.00    Pack years: 26.00    Types: Cigarettes    Quit date: 08/20/2017    Years since quitting: 1.7  . Smokeless tobacco: Never Used  Substance and Sexual Activity  . Alcohol use: No  . Drug use: No  . Sexual activity: Yes    Birth control/protection: None    Comment: HUSBAND VASECTOMY  Other Topics Concern  . Not on file  Social History Narrative   She works at [employer] in [employer], married with 2 children, 3  granddaughters, 1 great granddaughter.    Social Determinants of Health   Financial Resource Strain:   . Difficulty of Paying Living Expenses: Not on file  Food Insecurity:   . Worried About Charity fundraiser in the Last Year: Not on file  . Ran Out of Food in the Last Year: Not on file  Transportation Needs:   . Lack of Transportation (Medical): Not on file  . Lack of Transportation (Non-Medical): Not on file  Physical Activity:   . Days of Exercise per Week: Not on file  . Minutes of Exercise per Session: Not on file  Stress:   . Feeling of Stress : Not on file  Social Connections:   . Frequency of Communication with Friends and Family: Not on file  . Frequency of Social Gatherings with Friends and Family: Not on file  . Attends Religious Services: Not on file  . Active Member of Clubs or Organizations: Not on file  . Attends Archivist Meetings: Not on file  . Marital Status: Not on file    Family History  Problem Relation Age of Onset  . Breast cancer Mother   . Arthritis Mother   .  Depression Father   . Alcohol abuse Father   . Depression Brother   . Arthritis Maternal Grandmother   . Arthritis Brother     Health Maintenance  Topic Date Due  . HIV Screening  12/17/1980  . COLONOSCOPY  12/18/2015  . MAMMOGRAM  02/06/2019  . PAP SMEAR-Modifier  05/16/2021  . TETANUS/TDAP  11/04/2024  . INFLUENZA VACCINE  Completed    ----------------------------------------------------------------------------------------------------------------------------------------------------------------------------------------------------------------- Physical Exam BP (!) 152/82   Pulse (!) 103   Temp (!) 97.4 F (36.3 C) (Temporal)   Ht 5\' 7"  (1.702 m)   Wt 221 lb (100.2 kg)   LMP 03/21/2011   SpO2 98%   BMI 34.61 kg/m   Physical Exam Constitutional:      Appearance: Normal appearance.  Cardiovascular:     Rate and Rhythm: Normal rate and regular rhythm.    Pulmonary:     Effort: Pulmonary effort is normal.     Breath sounds: Normal breath sounds.  Neurological:     General: No focal deficit present.     Mental Status: She is alert.  Psychiatric:        Mood and Affect: Mood normal.        Behavior: Behavior normal.    Procedure note:  Procedure discussed in detail with patient including rare but potential complications of joint infection, bleeding inside of joint, skin depigmentation.  All questions were answered and she agreed to proceed with procedure.  She was prepped in typical sterile fashion with betadine and alcohol swabs.  A cold spray was applied to the skin and the knee was injected with 34mL of 40mg /mL depo-medrol and 50mL of 1% lidocaine.  Band aid was applied to injection site.  She tolerated procedure well without any immediate complications.  Post-procedure instructions were given.   ------------------------------------------------------------------------------------------------------------------------------------------------------------------------------------------------------------------- Assessment and Plan  Acute pain of left knee Joint injection completed today, see procedure note.   This visit occurred during the SARS-CoV-2 public health emergency.  Safety protocols were in place, including screening questions prior to the visit, additional usage of staff PPE, and extensive cleaning of exam room while observing appropriate contact time as indicated for disinfecting solutions.

## 2019-05-19 NOTE — Patient Instructions (Signed)
Joint Steroid Injection A joint steroid injection is a procedure to relieve swelling and pain in a joint. Steroids are medicines that reduce inflammation. In this procedure, your health care provider uses a syringe and a needle to inject a steroid medicine into a painful and inflamed joint. A pain-relieving medicine (anesthetic) may be injected along with the steroid. In some cases, your health care provider may use an imaging technique such as ultrasound or fluoroscopy to guide the injection. Joints that are often treated with steroid injections include the knee, shoulder, hip, and spine. These injections may also be used in the elbow, ankle, and joints of the hands or feet. You may have joint steroid injections as part of your treatment for inflammation caused by:  Gout.  Rheumatoid arthritis.  Advanced wear-and-tear arthritis (osteoarthritis).  Tendinitis.  Bursitis. Joint steroid injections may be repeated, but having them too often can damage a joint or the skin over the joint. You should not have joint steroid injections less than 6 weeks apart or more than four times a year. Tell a health care provider about:  Any allergies you have.  All medicines you are taking, including vitamins, herbs, eye drops, creams, and over-the-counter medicines.  Any problems you or family members have had with anesthetic medicines.  Any blood disorders you have.  Any surgeries you have had.  Any medical conditions you have.  Whether you are pregnant or may be pregnant. What are the risks? Generally, this is a safe treatment. However, problems may occur, including:  Infection.  Bleeding.  Allergic reactions to medicines.  Damage to the joint or tissues around the joint.  Thinning of skin or loss of skin color over the joint.  Temporary flushing of the face or chest.  Temporary increase in pain.  Temporary increase in blood sugar.  Failure to relieve inflammation or pain. What  happens before the treatment?  You may have imaging tests of your joint.  Ask your health care provider about: ? Changing or stopping your regular medicines. This is especially important if you are taking diabetes medicines or blood thinners. ? Taking medicines such as aspirin and ibuprofen. These medicines can thin your blood. Do not take these medicines unless your health care provider tells you to take them. ? Taking over-the-counter medicines, vitamins, herbs, and supplements.  Ask your health care provider if you can drive yourself home after the procedure. What happens during the treatment?   Your health care provider will position you for the injection and locate the injection site over your joint.  The skin over the joint will be cleaned with a germ-killing soap.  Your health care provider may: ? Spray a numbing solution (topical anesthetic) over the injection site. ? Inject a local anesthetic under the skin above your joint.  The needle will be placed through your skin into your joint. Your health care provider may use imaging to guide the needle to the right spot for the injection. If imaging is used, a special contrast dye may be injected to confirm that the needle is in the correct location.  The steroid medicine will be injected into your joint.  Anesthetic may be injected along with the steroid. This may be a medicine that relieves pain for a short time (short-acting anesthetic) or for a longer time (long-acting anesthetic).  The needle will be removed, and an adhesive bandage (dressing) will be placed over the injection site. The procedure may vary among health care providers and hospitals. What can I   expect after the treatment?  You will be able to go home after the treatment.  It is normal to feel slight flushing for a few days after the injection.  After the treatment, it is common to have an increase in joint pain after the anesthetic has worn off. This may  happen about an hour after a short-acting anesthetic or about 8 hours after a longer-acting anesthetic.  You should begin to feel relief from joint pain and swelling after 24 to 48 hours. Follow these instructions at home: Injection site care  Leave the adhesive dressing over your injection site in place until your health care provider says you can remove it.  Check your injection site every day for signs of infection. Check for: ? Redness, swelling, or pain. ? Fluid or blood. ? Warmth. ? Pus or a bad smell. Activity  Return to your normal activities as told by your health care provider. Ask your health care provider what activities are safe for you. You may be asked to limit activities that put stress on the joint for a few days.  Do joint exercises as told by your health care provider.  Do not take baths, swim, or use a hot tub until your health care provider approves. Managing pain, stiffness, and swelling   If directed, put ice on the joint. ? Put ice in a plastic bag. ? Place a towel between your skin and the bag. ? Leave the ice on for 20 minutes, 2-3 times a day.  Raise (elevate) your joint above the level of your heart when you are sitting or lying down. General instructions  Take over-the-counter and prescription medicines only as told by your health care provider.  Do not use any products that contain nicotine or tobacco, such as cigarettes, e-cigarettes, and chewing tobacco. These can delay joint healing. If you need help quitting, ask your health care provider.  If you have diabetes, be aware that your blood sugar may be slightly elevated for several days after the injection.  Keep all follow-up visits as told by your health care provider. This is important. Contact a health care provider if you have:  Chills or a fever.  Any signs of infection at your injection site.  Increased pain or swelling or no relief after 2 days. Summary  A joint steroid injection  is a treatment to relieve pain and swelling in a joint.  Steroids are medicines that reduce inflammation. Your health care provider may add an anesthetic along with the steroid.  You may have joint steroid injections as part of your arthritis treatment.  Joint steroid injections may be repeated, but having them too often can damage a joint or the skin over the joint.  Contact your health care provider if you have a fever, chills, or signs of infection or if you get no relief from joint pain or swelling. This information is not intended to replace advice given to you by your health care provider. Make sure you discuss any questions you have with your health care provider. Document Released: 01/21/2018 Document Revised: 01/21/2018 Document Reviewed: 01/21/2018 Elsevier Patient Education  2020 [employer] Inc.  

## 2019-05-19 NOTE — Assessment & Plan Note (Signed)
Joint injection completed today, see procedure note.

## 2019-08-04 ENCOUNTER — Other Ambulatory Visit: Payer: Self-pay

## 2019-08-04 ENCOUNTER — Emergency Department (HOSPITAL_COMMUNITY)
Admission: EM | Admit: 2019-08-04 | Discharge: 2019-08-04 | Disposition: A | Discharge status: Home / Self Care | Payer: Managed Care, Other (non HMO) | Attending: Emergency Medicine | Admitting: Emergency Medicine

## 2019-08-04 ENCOUNTER — Encounter (HOSPITAL_COMMUNITY): Payer: Self-pay | Admitting: Emergency Medicine

## 2019-08-04 DIAGNOSIS — Y93G9 Activity, other involving cooking and grilling: Secondary | ICD-10-CM | POA: Insufficient documentation

## 2019-08-04 DIAGNOSIS — S61201A Unspecified open wound of left index finger without damage to nail, initial encounter: Secondary | ICD-10-CM | POA: Insufficient documentation

## 2019-08-04 DIAGNOSIS — W260XXA Contact with knife, initial encounter: Secondary | ICD-10-CM | POA: Insufficient documentation

## 2019-08-04 DIAGNOSIS — Z87891 Personal history of nicotine dependence: Secondary | ICD-10-CM | POA: Diagnosis not present

## 2019-08-04 DIAGNOSIS — Y929 Unspecified place or not applicable: Secondary | ICD-10-CM | POA: Diagnosis not present

## 2019-08-04 DIAGNOSIS — Y999 Unspecified external cause status: Secondary | ICD-10-CM | POA: Insufficient documentation

## 2019-08-04 DIAGNOSIS — Z79899 Other long term (current) drug therapy: Secondary | ICD-10-CM | POA: Diagnosis not present

## 2019-08-04 DIAGNOSIS — S61209A Unspecified open wound of unspecified finger without damage to nail, initial encounter: Secondary | ICD-10-CM

## 2019-08-04 NOTE — ED Notes (Signed)
Patient verbalizes understanding of discharge instructions. Opportunity for questioning and answers were provided. Armband removed by staff, pt discharged from ED ambulatory to home.  

## 2019-08-04 NOTE — Discharge Instructions (Signed)
°  Wound Care - General Wound Cleaning: You may remove the bandage after about 24 hours.  You may wash the surrounding skin with soap and water, but avoid wetting the artificial scab as much as possible.  Do not scrub the wound, as this may cause the artificial scab to become dislodged.  You may shower, but avoid submerging the wound, such as with a bath or swimming.  Do not allow water pressure against the artificial scab. Once the artificial scab falls off, clean the wound daily to prevent infection.  Do not use cleaners such as hydrogen peroxide or alcohol.   Scar reduction: Application of a topical antibiotic ointment, such as Neosporin, after the wound has begun to close and heal well can decrease scab formation and reduce scarring. After the wound has healed, application of ointments such as Aquaphor can also reduce scar formation.  The key to scar reduction is keeping the skin well hydrated and supple. Drinking plenty of water throughout the day (At least eight 8oz glasses of water a day) is essential to staying well hydrated.  Pain: You may use Tylenol, naproxen, or ibuprofen for pain. Antiinflammatory medications: Take 600 mg of ibuprofen every 6 hours or 440 mg (over the counter dose) to 500 mg (prescription dose) of naproxen every 12 hours for the next 3 days. After this time, these medications may be used as needed for pain. Take these medications with food to avoid upset stomach. Choose only one of these medications, do not take them together. Acetaminophen (generic for Tylenol): Should you continue to have additional pain while taking the ibuprofen or naproxen, you may add in acetaminophen as needed. Your daily total maximum amount of acetaminophen from all sources should be limited to 4000mg /day for persons without liver problems, or 2000mg /day for those with liver problems.  Return: Return to the ED should signs of infection arise, such as spreading redness, puffiness/swelling, pus  draining from the wound, severe increase in pain, fever over 100.76F, or any other major issues.  For prescription assistance, may try using prescription discount sites or apps, such as goodrx.com

## 2019-08-04 NOTE — ED Triage Notes (Signed)
Patient accidentally sliced her left distal tip of index finger with a knife this evening with moderate bleeding . Dressing applied by pt. prior to arrival .

## 2019-08-04 NOTE — ED Provider Notes (Signed)
[facility] EM[facility] DEPARTMENT Provider Note   CSN: [id number] Arrival date & time: 08/04/19  1958     History Chief Complaint  Patient presents with  . Laceration    Finger    Cindy Boyer is a 54 y.o. female.  HPI      Cindy Boyer is a 54 y.o. female, patient with no pertinent past medical history, presenting to the ED with a cut to her left index finger that occurred shortly prior to arrival. She was scooping better with a knife in her kitchen, slipped, and cut her finger. Tetanus up-to-date. Denies numbness, other injuries.      Past Medical History:  Diagnosis Date  . Bronchitis    HISTORY AS CHILD, NO PROBLEMS AS ADULT   . Cancer ([place])    Cervical  . Environmental allergies     Patient Active Problem List   Diagnosis Date Noted  . Acute pain of left knee 05/12/2019    Past Surgical History:  Procedure Laterality Date  . ABDOMINAL HYSTERECTOMY  2012  . BREAST BIOPSY  2018  . CERVICAL CONIZATION W/BX  05/14/2011   Procedure: CONIZATION CERVIX WITH BIOPSY;  Surgeon: Catha Brow;  Location: [facility];  Service: Gynecology;  Laterality: N/A;  . COLPOSCOPY    . SVD     X 1  . WISDOM TOOTH EXTRACTION       OB History   No obstetric history on file.     Family History  Problem Relation Age of Onset  . Breast cancer Mother   . Arthritis Mother   . Depression Father   . Alcohol abuse Father   . Depression Brother   . Arthritis Maternal Grandmother   . Arthritis Brother     Social History   Tobacco Use  . Smoking status: Former Smoker    Packs/day: 1.00    Years: 26.00    Pack years: 26.00    Types: Cigarettes    Quit date: 08/20/2017    Years since quitting: 1.9  . Smokeless tobacco: Never Used  Substance Use Topics  . Alcohol use: No  . Drug use: No    Home Medications Prior to Admission medications   Medication Sig Start Date End Date Taking? Authorizing Provider  Aspirin-Salicylamide-Caffeine (BC HEADACHE  POWDER PO) Take by mouth.   Yes [provider]  calcium carbonate (TUMS - DOSED IN MG ELEMENTAL CALCIUM) 500 MG chewable tablet Chew 1 tablet by mouth daily.   Yes [provider]  cetirizine (ZYRTEC) 10 MG tablet Take 10 mg by mouth daily.   Yes [provider]  Cholecalciferol (VITAMIN D) 50 MCG (2000 UT) CAPS Take 1 capsule by mouth daily.   Yes [provider]  diclofenac Sodium (VOLTAREN) 1 % GEL Apply 4 g topically 4 (four) times daily. 05/12/19  Yes Luetta Nutting, DO  ibuprofen (ADVIL) 200 MG tablet Take 200 mg by mouth every 6 (six) hours as needed for moderate pain.    Yes [provider]  lisinopril (ZESTRIL) 20 MG tablet TAKE ONE TABLET BY MOUTH DAILY Patient taking differently: Take 20 mg by mouth daily.  05/04/19  Yes Cirigliano, Garvin Fila, DO  Multiple Vitamin (MULTIVITAMIN) capsule Take 1 capsule by mouth daily.   Yes [provider]    Allergies    Penicillins  Review of Systems   Review of Systems  Skin: Positive for wound.  Neurological: Negative for weakness and numbness.    Physical Exam  Updated Vital Signs BP (!) 144/73 (BP Location: Right Arm)   Pulse 98   Temp 97.6 F (36.4 C) (Oral)   Resp 16   Ht 5\' 7"  (1.702 m)   Wt 65 kg   LMP 03/21/2011   SpO2 99%   BMI 22.44 kg/m   Physical Exam Vitals and nursing note reviewed.  Constitutional:      General: She is not in acute distress.    Appearance: She is well-developed. She is not diaphoretic.  HENT:     Head: Normocephalic and atraumatic.  Eyes:     Conjunctiva/sclera: Conjunctivae normal.  Cardiovascular:     Rate and Rhythm: Normal rate and regular rhythm.  Pulmonary:     Effort: Pulmonary effort is normal.  Musculoskeletal:     Cervical back: Neck supple.     Comments: Approximately 1 cm long avulsion injury to the distal, radial left index finger with steady capillary hemorrhage. Full range of motion in the joints of the index finger.   Skin:    General: Skin is warm and dry.     Coloration: Skin is not pale.  Neurological:     Mental Status: She is alert.     Comments: Sensation to light touch grossly intact in the left index finger.  Psychiatric:        Behavior: Behavior normal.     ED Results / Procedures / Treatments   Labs (all labs ordered are listed, but only abnormal results are displayed) Labs Reviewed - No data to display  EKG None  Radiology No results found.  Procedures Procedures (including critical care time)  Medications Ordered in ED Medications - No data to display  ED Course  I have reviewed the triage vital signs and the nursing notes.  Pertinent labs & imaging results that were available during my care of the patient were reviewed by me and considered in my medical decision making (see chart for details).    MDM Rules/Calculators/A&P                      Patient presents with small avulsion injury to the tip of her left index finger.  Hemostasis achieved with application of hemostatic powder and direct pressure. The patient was given instructions for home care as well as return precautions. Patient voices understanding of these instructions, accepts the plan, and is comfortable with discharge.     Final Clinical Impression(s) / ED Diagnoses Final diagnoses:  Avulsion of finger, initial encounter    Rx / DC Orders ED Discharge Orders    None       Layla Maw 08/04/19 2155    Deno Etienne, DO 08/04/19 2226

## 2020-01-06 ENCOUNTER — Ambulatory Visit: Payer: Managed Care, Other (non HMO) | Admitting: Family Medicine

## 2020-01-06 ENCOUNTER — Other Ambulatory Visit: Payer: Self-pay

## 2020-01-06 ENCOUNTER — Encounter: Payer: Self-pay | Admitting: Family Medicine

## 2020-01-06 VITALS — BP 146/80 | HR 93 | Temp 97.5°F | Ht 67.0 in | Wt 217.2 lb

## 2020-01-06 DIAGNOSIS — M170 Bilateral primary osteoarthritis of knee: Secondary | ICD-10-CM

## 2020-01-06 MED ORDER — METHYLPREDNISOLONE ACETATE 80 MG/ML IJ SUSP
40.0000 mg | Freq: Once | INTRAMUSCULAR | Status: AC
  Administered 2020-01-06: 40 mg via INTRA_ARTICULAR

## 2020-01-06 NOTE — Patient Instructions (Signed)
Health Maintenance Due  Topic Date Due   Hepatitis C Screening  Never done   HIV Screening  Never done   COLONOSCOPY  Never done   MAMMOGRAM  02/06/2019   INFLUENZA VACCINE  01/03/2020    No flowsheet data found.

## 2020-01-06 NOTE — Progress Notes (Signed)
Cindy Boyer is a 54 y.o. female  Chief Complaint  Patient presents with  . Knee Pain    Pt c/o knee pain in both knees.  Pt has a hx of knee pain worsen in the last 2 weeks   HPI:  Cindy Boyer is a 54 y.o. female who complains of B/L knee pain that is chronic in nature but worse in the past 2 weeks. She saw Dr. Zigmund Daniel in the past x 2 for Lt knee pain and in 05/2019 he gave pt a cortisone injection in Lt knee. She states injection gave her relief x 4-5 mo.  Xray in 05/2019 showed "advanced OA".  She currently complains of 2 weeks of increased knee pain. She would like B/L knee injection today. She has not seen ortho. 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  Past Medical History:  Diagnosis Date  . Bronchitis    HISTORY AS CHILD, NO PROBLEMS AS ADULT   . Cancer ([place])    Cervical  . Environmental allergies     Past Surgical History:  Procedure Laterality Date  . ABDOMINAL HYSTERECTOMY  2012  . BREAST BIOPSY  2018  . CERVICAL CONIZATION W/BX  05/14/2011   Procedure: CONIZATION CERVIX WITH BIOPSY;  Surgeon: Catha Brow;  Location: [facility];  Service: Gynecology;  Laterality: N/A;  . COLPOSCOPY    . SVD     X 1  . WISDOM TOOTH EXTRACTION      Social History   Socioeconomic History  . Marital status: Married    Spouse name: Not on file  . Number of children: Not on file  . Years of education: Not on file  . Highest education level: Not on file  Occupational History  . Not on file  Tobacco Use  . Smoking status: Former Smoker    Packs/day: 1.00    Years: 26.00    Pack years: 26.00    Types: Cigarettes    Quit date: 08/20/2017    Years since quitting: 2.3  . Smokeless tobacco: Never Used  Substance and Sexual Activity  . Alcohol use: No  . Drug use: No  . Sexual activity: Yes    Birth control/protection: None    Comment: HUSBAND VASECTOMY  Other Topics Concern  . Not on file  Social History Narrative   She works at  [employer] in [employer], married with 2 children, 3 granddaughters, 1 great granddaughter.    Social Determinants of Health   Financial Resource Strain:   . Difficulty of Paying Living Expenses:   Food Insecurity:   . Worried About Charity fundraiser in the Last Year:   . Arboriculturist in the Last Year:   Transportation Needs:   . Film/video editor (Medical):   Marland Kitchen Lack of Transportation (Non-Medical):   Physical Activity:   . Days of Exercise per Week:   . Minutes of Exercise per Session:   Stress:   . Feeling of Stress :   Social Connections:   . Frequency of Communication with Friends and Family:   . Frequency of Social Gatherings with Friends and Family:   . Attends Religious Services:   . Active Member of Clubs or Organizations:   . Attends Archivist Meetings:   Marland Kitchen Marital Status:   Intimate Partner Violence:   . Fear of Current or Ex-Partner:   . Emotionally Abused:   .  Physically Abused:   . Sexually Abused:     Family History  Problem Relation Age of Onset  . Breast cancer Mother   . Arthritis Mother   . Depression Father   . Alcohol abuse Father   . Depression Brother   . Arthritis Maternal Grandmother   . Arthritis Brother      Immunization History  Administered Date(s) Administered  . Influenza-Unspecified 03/09/2019  . PFIZER SARS-COV-2 Vaccination 08/16/2019, 09/08/2019    Outpatient Encounter Medications as of 01/06/2020  Medication Sig  . Aspirin-Salicylamide-Caffeine (BC HEADACHE POWDER PO) Take by mouth.  . calcium carbonate (TUMS - DOSED IN MG ELEMENTAL CALCIUM) 500 MG chewable tablet Chew 1 tablet by mouth daily.  . cetirizine (ZYRTEC) 10 MG tablet Take 10 mg by mouth daily.  . Cholecalciferol (VITAMIN D) 50 MCG (2000 UT) CAPS Take 1 capsule by mouth daily.  Marland Kitchen lisinopril (ZESTRIL) 20 MG tablet TAKE ONE TABLET BY MOUTH DAILY (Patient taking differently: Take 20 mg by mouth daily. )  . Multiple Vitamin (MULTIVITAMIN) capsule  Take 1 capsule by mouth daily.  . diclofenac Sodium (VOLTAREN) 1 % GEL Apply 4 g topically 4 (four) times daily. (Patient not taking: Reported on 01/06/2020)  . ibuprofen (ADVIL) 200 MG tablet Take 200 mg by mouth every 6 (six) hours as needed for moderate pain.  (Patient not taking: Reported on 01/06/2020)   No facility-administered encounter medications on file as of 01/06/2020.     ROS: Pertinent positives and negatives noted in HPI. Remainder of ROS non-contributory    Allergies  Allergen Reactions  . Penicillins Itching, Swelling and Rash    Did it involve swelling of the face/tongue/throat, SOB, or low BP? No Did it involve sudden or severe rash/hives, skin peeling, or any reaction on the inside of your mouth or nose? Yes Did you need to seek medical attention at a hospital or doctor's office? Yes When did it last happen?54 years old  If all above answers are "NO", may proceed with cephalosporin use.    Pulse 93   Temp (!) 97.5 F (36.4 C) (Temporal)   LMP 03/21/2011   Physical Exam Constitutional:      General: She is not in acute distress.    Appearance: Normal appearance. She is not ill-appearing.  Musculoskeletal:     Right knee: Crepitus present. No swelling, effusion or erythema. Tenderness present.     Left knee: Crepitus present. No swelling, effusion or erythema. Tenderness present.  Skin:    General: Skin is warm and dry.  Neurological:     Mental Status: She is alert and oriented to person, place, and time.  Psychiatric:        Mood and Affect: Mood normal.        Behavior: Behavior normal.      Procedure note:  Procedure discussed in detail with patient including rare but potential complications of joint infection, bleeding inside of joint, skin depigmentation.  All questions were answered and she agreed to proceed with procedure.  She was prepped in typical sterile fashion with alcohol swabs x 2 to both Rt and Lt knee . Using a lateral approach, Lt  knee was injected with 0.55mL of 80mg /mL depo-medrol and 51mL of 1% lidocaine.  Band aid was applied to injection site. Using a lateral approach, Rt knee as injected with 0.39mL of 80mg /mL depo-medrol and 47mL of 1% lidocaine. Band aid was applied to injection site. She tolerated procedure well without any immediate complications.  Post-procedure instructions were  given.    A/P:  1. Primary osteoarthritis of both knees - acute on chronic, worse in the past 2 wks - received cortisone injection in Lt knee in 05/2019 and had good relief x 4-77mo - B/L knee injections done today - methylPREDNISolone acetate (DEPO-MEDROL) injection 40 mg - methylPREDNISolone acetate (DEPO-MEDROL) injection 40 mg - f/u PRN Discussed plan and reviewed medications with patient, including risks, benefits, and potential side effects. Pt expressed understand. All questions answered.    This visit occurred during the SARS-CoV-2 public health emergency.  Safety protocols were in place, including screening questions prior to the visit, additional usage of staff PPE, and extensive cleaning of exam room while observing appropriate contact time as indicated for disinfecting solutions.

## 2020-01-22 ENCOUNTER — Other Ambulatory Visit: Payer: Self-pay | Admitting: Family Medicine

## 2020-01-22 DIAGNOSIS — I10 Essential (primary) hypertension: Secondary | ICD-10-CM

## 2020-01-25 ENCOUNTER — Encounter: Payer: Self-pay | Admitting: Family Medicine

## 2020-01-26 NOTE — Telephone Encounter (Signed)
Medication refill request approved and submitted to pharmacy per standing orders.

## 2020-01-27 MED ORDER — LISINOPRIL 20 MG PO TABS: 20.0000 mg | ORAL_TABLET | Freq: Every day | ORAL | 3 refills | Status: DC

## 2020-01-27 NOTE — Addendum Note (Signed)
Addended by: Ronnald Nian on: 01/27/2020 07:47 AM   Modules accepted: Orders

## 2020-02-10 ENCOUNTER — Encounter: Payer: Self-pay | Admitting: Family Medicine

## 2020-06-04 HISTORY — PX: REPLACEMENT TOTAL KNEE: SUR1224

## 2020-07-06 IMAGING — DX DG KNEE COMPLETE 4+V*L*
4 series · 4 of 4 positions shown · non-contrast
Comparison: None.

CLINICAL DATA: Left knee pain for 2 weeks after a fall. Initial
encounter.

EXAM:
LEFT KNEE - COMPLETE 4+ VIEW

[knee ap]
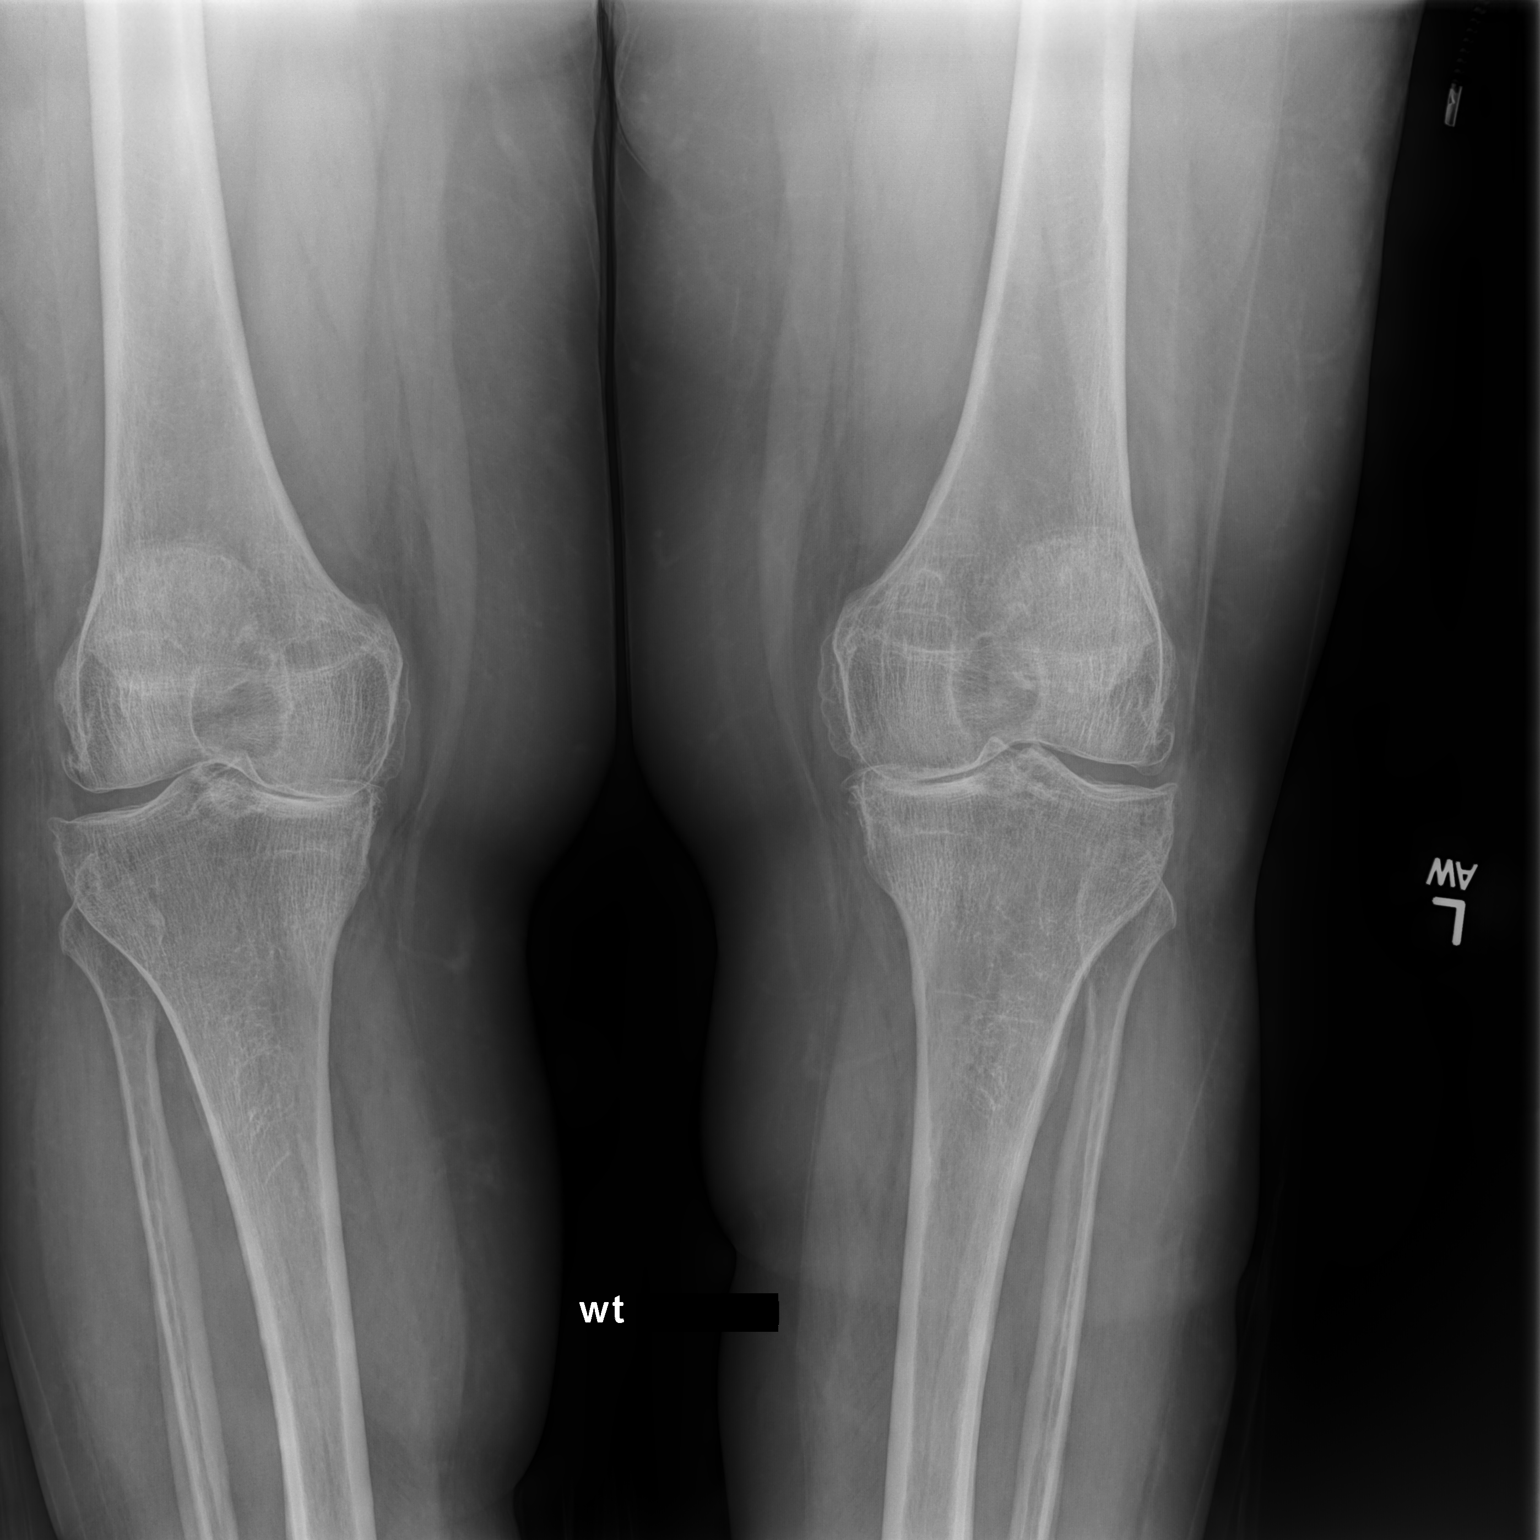

[knee [person_name] view pa]
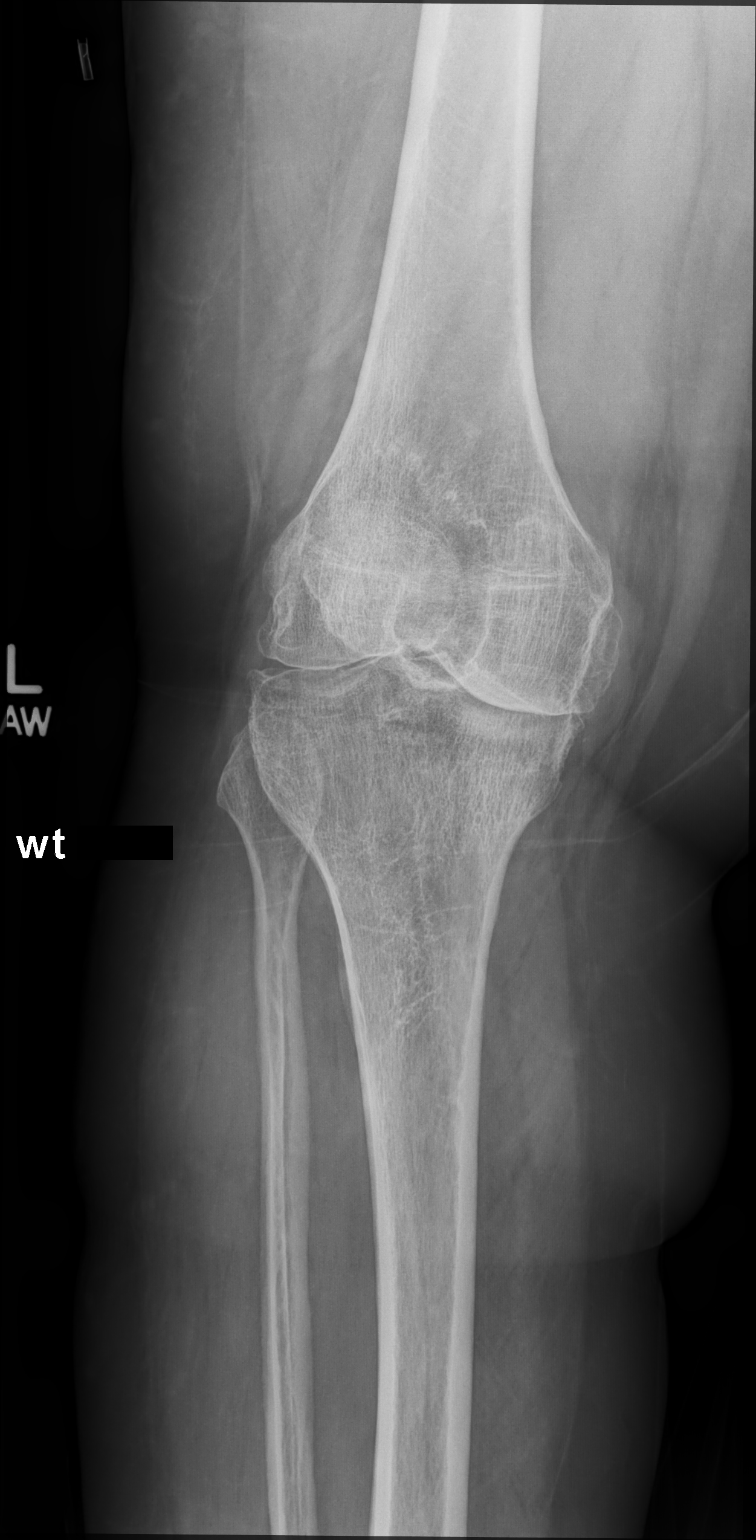

[knee lat]
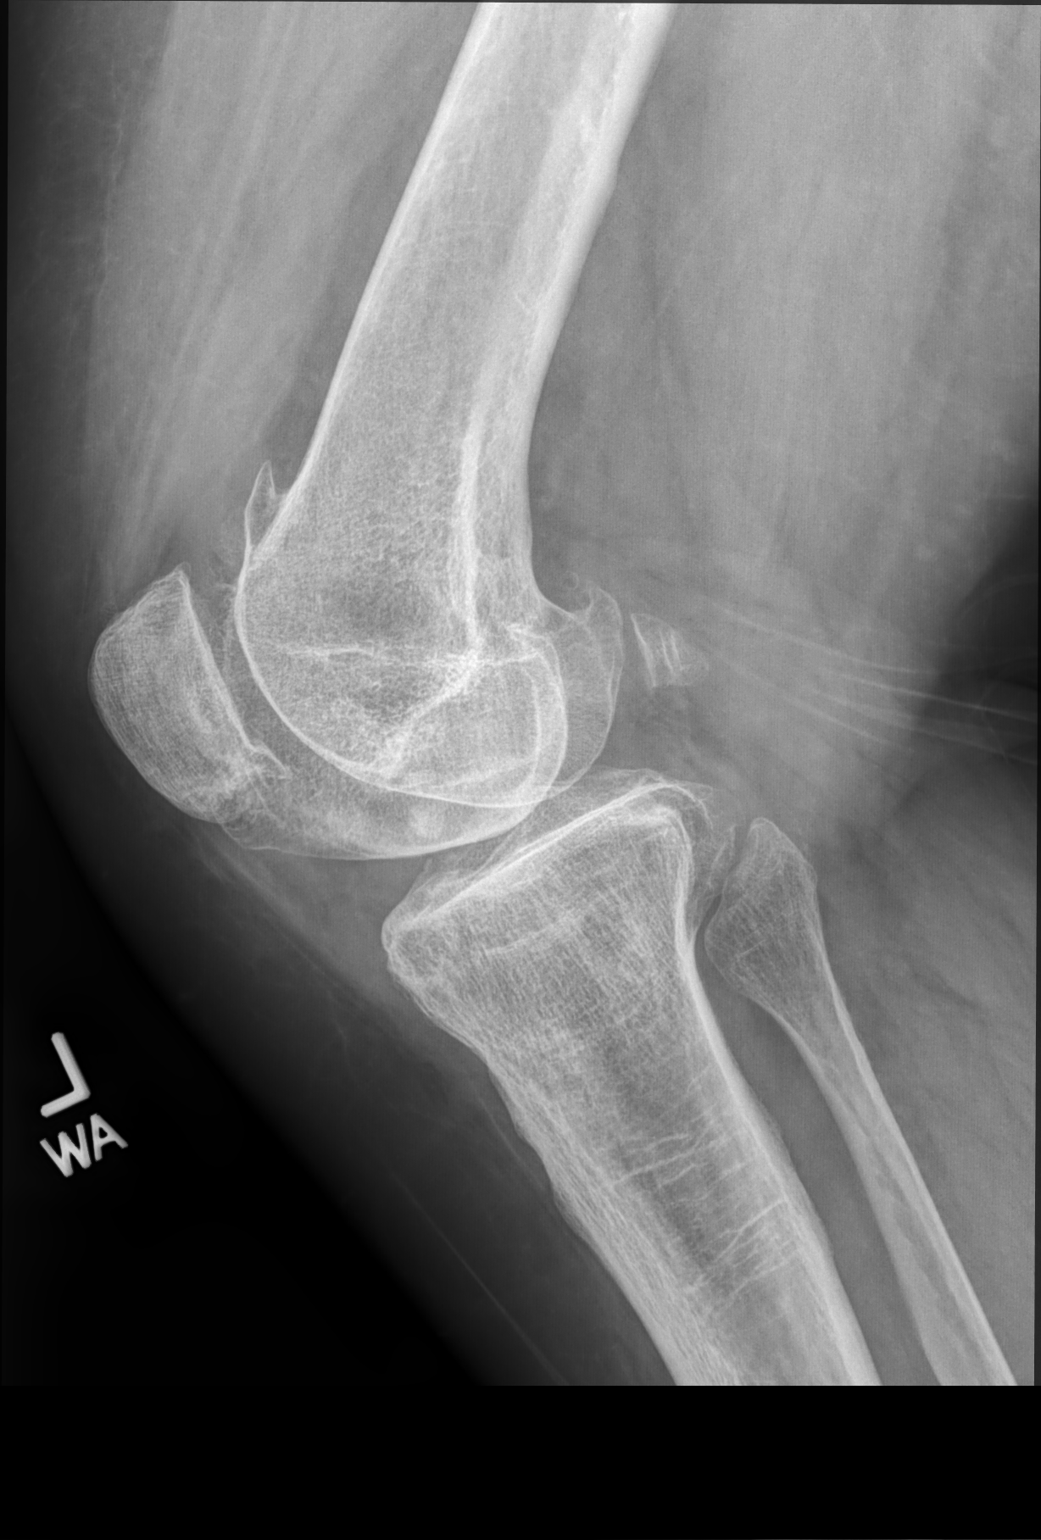

[patella (sunrise)]
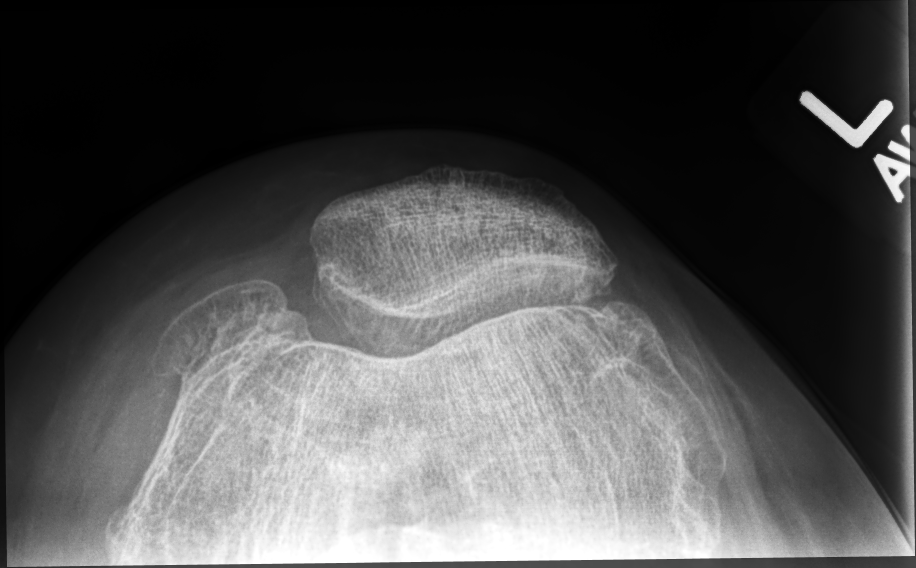

[4 of 4 positions shown; findings below may reference images not displayed]

FINDINGS: There is no acute bony or joint abnormality. The patient has
advanced osteoarthritis about both knees which is worst in the
medial compartments where there is bone-on-bone joint space
narrowing on the right and left. Bulky osteophytes are seen about
all 3 compartments of the left knee. No lateral view of the right
knee is provided. No chondrocalcinosis. No focal bony lesion.
IMPRESSION: No acute abnormality.

Advanced osteoarthritis.

## 2021-03-09 ENCOUNTER — Ambulatory Visit (INDEPENDENT_AMBULATORY_CARE_PROVIDER_SITE_OTHER): Payer: Managed Care, Other (non HMO) | Admitting: Family Medicine

## 2021-03-09 ENCOUNTER — Encounter: Payer: Self-pay | Admitting: Family Medicine

## 2021-03-09 ENCOUNTER — Other Ambulatory Visit: Payer: Self-pay

## 2021-03-09 VITALS — BP 156/78 | HR 85 | Temp 97.5°F | Ht 67.0 in | Wt 213.0 lb

## 2021-03-09 DIAGNOSIS — M170 Bilateral primary osteoarthritis of knee: Secondary | ICD-10-CM

## 2021-03-09 DIAGNOSIS — I10 Essential (primary) hypertension: Secondary | ICD-10-CM

## 2021-03-09 MED ORDER — LISINOPRIL 40 MG PO TABS: 40.0000 mg | ORAL_TABLET | Freq: Every day | ORAL | 3 refills | Status: DC

## 2021-03-09 NOTE — Progress Notes (Signed)
Established Patient Office Visit  Subjective:  Patient ID: Cindy Boyer, female    DOB: June 01, 1966  Age: 55 y.o. MRN: [id number]  CC:  Chief Complaint  Patient presents with   Knee Pain    Knee pain in both knees, patient would like cortisone injections.     HPI Cindy Boyer presents for follow-up of bilateral knee pain.  Slightly worse on the right.  Standing history of severe osteoarthritis with bone-on-bone involvement.  She is compliant with her Zestril 20 mg daily.  She has experienced some headaches.  Denies blurred vision chest pain or shortness of breath.  Her job is stressful.  Quit smoking 3 years ago.  She does not drink alcohol or use illicit drugs.  She requests knee injections.  Past Medical History:  Diagnosis Date   Bronchitis    HISTORY AS CHILD, NO PROBLEMS AS ADULT    Cancer ([place])    Cervical   Environmental allergies     Past Surgical History:  Procedure Laterality Date   ABDOMINAL HYSTERECTOMY  2012   BREAST BIOPSY  2018   CERVICAL CONIZATION W/BX  05/14/2011   Procedure: CONIZATION CERVIX WITH BIOPSY;  Surgeon: Catha Brow;  Location: [facility];  Service: Gynecology;  Laterality: N/A;   COLPOSCOPY     SVD     X 1   WISDOM TOOTH EXTRACTION      Family History  Problem Relation Age of Onset   Breast cancer Mother    Arthritis Mother    Depression Father    Alcohol abuse Father    Depression Brother    Arthritis Maternal Grandmother    Arthritis Brother     Social History   Socioeconomic History   Marital status: Married    Spouse name: Not on file   Number of children: Not on file   Years of education: Not on file   Highest education level: Not on file  Occupational History   Not on file  Tobacco Use   Smoking status: Former    Packs/day: 1.00    Years: 26.00    Pack years: 26.00    Types: Cigarettes    Quit date: 08/20/2017    Years since quitting: 3.5   Smokeless tobacco: Never  Substance and Sexual Activity   Alcohol use: No    Drug use: No   Sexual activity: Yes    Birth control/protection: None    Comment: HUSBAND VASECTOMY  Other Topics Concern   Not on file  Social History Narrative   She works at [employer] in [employer], married with 2 children, 3 granddaughters, 1 great granddaughter.    Social Determinants of Health   Financial Resource Strain: Not on file  Food Insecurity: Not on file  Transportation Needs: Not on file  Physical Activity: Not on file  Stress: Not on file  Social Connections: Not on file  Intimate Partner Violence: Not on file    Outpatient Medications Prior to Visit  Medication Sig Dispense Refill   Aspirin-Salicylamide-Caffeine (BC HEADACHE POWDER PO) Take by mouth.     calcium carbonate (TUMS - DOSED IN MG ELEMENTAL CALCIUM) 500 MG chewable tablet Chew 1 tablet by mouth daily.     cetirizine (ZYRTEC) 10 MG tablet Take 10 mg by mouth daily.     Cholecalciferol (VITAMIN D) 50 MCG (2000 UT) CAPS Take 1 capsule by mouth daily.     ibuprofen (ADVIL) 200 MG tablet Take 200 mg by mouth every 6 (  six) hours as needed for moderate pain.     Multiple Vitamin (MULTIVITAMIN) capsule Take 1 capsule by mouth daily.     lisinopril (ZESTRIL) 20 MG tablet Take 1 tablet (20 mg total) by mouth daily. 90 tablet 3   diclofenac Sodium (VOLTAREN) 1 % GEL Apply 4 g topically 4 (four) times daily. (Patient not taking: Reported on 01/06/2020) 100 g 0   No facility-administered medications prior to visit.    Allergies  Allergen Reactions   Penicillins Itching, Swelling and Rash    Did it involve swelling of the face/tongue/throat, SOB, or low BP? No Did it involve sudden or severe rash/hives, skin peeling, or any reaction on the inside of your mouth or nose? Yes Did you need to seek medical attention at a hospital or doctor's office? Yes When did it last happen?  55 years old      If all above answers are "NO", may proceed with cephalosporin use.    ROS Review of Systems  Constitutional:   Negative for chills, diaphoresis, fatigue, fever and unexpected weight change.  HENT: Negative.    Eyes:  Negative for photophobia and visual disturbance.  Respiratory: Negative.    Cardiovascular: Negative.   Gastrointestinal: Negative.   Genitourinary: Negative.   Musculoskeletal:  Positive for arthralgias and gait problem.  Neurological:  Negative for speech difficulty and weakness.  Psychiatric/Behavioral: Negative.       Objective:    Physical Exam Vitals and nursing note reviewed.  Constitutional:      General: She is not in acute distress.    Appearance: Normal appearance. She is not ill-appearing, toxic-appearing or diaphoretic.  HENT:     Head: Normocephalic and atraumatic.     Right Ear: Tympanic membrane, ear canal and external ear normal.     Left Ear: Tympanic membrane, ear canal and external ear normal.     Mouth/Throat:     Mouth: Mucous membranes are moist.     Pharynx: Oropharynx is clear. No oropharyngeal exudate or posterior oropharyngeal erythema.  Eyes:     General: No scleral icterus.       Right eye: No discharge.        Left eye: No discharge.     Extraocular Movements: Extraocular movements intact.     Conjunctiva/sclera: Conjunctivae normal.     Pupils: Pupils are equal, round, and reactive to light.  Cardiovascular:     Rate and Rhythm: Normal rate and regular rhythm.  Pulmonary:     Effort: Pulmonary effort is normal.     Breath sounds: Normal breath sounds.  Musculoskeletal:     Cervical back: No rigidity or tenderness.     Right knee: Swelling present. No erythema. Tenderness present over the medial joint line.     Left knee: Swelling present. No erythema. Tenderness present over the medial joint line.     Comments: Small effusions both knees.   Lymphadenopathy:     Cervical: No cervical adenopathy.  Neurological:     Mental Status: She is alert.    BP (!) 156/78 (BP Location: Right Arm, Patient Position: Sitting, Cuff Size: Large)    Pulse 85   Temp (!) 97.5 F (36.4 C) (Temporal)   Ht 5\' 7"  (1.702 m)   Wt 213 lb (96.6 kg)   LMP 03/21/2011   SpO2 97%   BMI 33.36 kg/m  Wt Readings from Last 3 Encounters:  03/09/21 213 lb (96.6 kg)  01/06/20 217 lb 3.2 oz (98.5 kg)  08/04/19 143  lb 4.8 oz (65 kg)   Aspiration/Injection Procedure Note Cindy Boyer [id number] 1966-04-28  Procedure: Injection Indications: severe oa of left knee.   Procedure Details Consent: Risks of procedure as well as the alternatives and risks of each were explained to the (patient/caregiver).  Consent for procedure obtained. Time Out: Verified patient identification, verified procedure, site/side was marked, verified correct patient position, special equipment/implants available, medications/allergies/relevent history reviewed, required imaging and test results available.  Performed  Manuel's Imperial inferior pole of the patella was identified and marked.  Cleansed with Betadine x3.  Was injected with 1 cc of Depo-Medrol and 3 cc of 2% lidocaine. Local Anesthesia Used: not used Amount of Fluid Aspirated:  na Character of Fluid:  na  Fluid was sent for: na A sterile dressing was applied.  Patient did tolerate procedure well.  Patient reported relief. Estimated blood loss: 0 ml  Libby Maw 03/09/2021, 1:52 PM Aspiration/Injection Procedure Note Cindy Boyer [id number] [birth year]  Procedure: Injection Indications: severe oa of right knee  Procedure Details Consent: Risks of procedure as well as the alternatives and risks of each were explained to the (patient/caregiver).  Consent for procedure obtained. Time Out: Verified patient identification, verified procedure, site/side was marked, verified correct patient position, special equipment/implants available, medications/allergies/relevent history reviewed, required imaging and test results available.  Performed  Superior medial inferior pole of the patella was identified and  marked.  It was cleansed with Betadine x3.  This was followed by injection of 1 cc of Depo-Medrol with 3 cc of 2% lidocaine. Local Anesthesia Used: none Amount of Fluid Aspirated:  na Character of Fluid:  na Fluid was sent for: na A sterile dressing was applied.  Patient did tolerate procedure well.  Patient reported relief.  Estimated blood loss: 0 ml  Libby Maw 03/09/2021, 1:52 PM   Health Maintenance Due  Topic Date Due   HIV Screening  Never done   Hepatitis C Screening  Never done   COLONOSCOPY (Pts 45-30yrs Insurance coverage will need to be confirmed)  Never done   Zoster Vaccines- Shingrix (1 of 2) Never done   MAMMOGRAM  02/06/2019   INFLUENZA VACCINE  01/02/2021    There are no preventive care reminders to display for this patient.  No results found for: TSH Lab Results  Component Value Date   WBC 9.6 05/14/2011   HGB 13.7 05/14/2011   HCT 41.5 05/14/2011   MCV 92.4 05/14/2011   PLT 389 05/14/2011   Lab Results  Component Value Date   NA 139 05/16/2018   K 4.4 05/16/2018   CO2 24 05/16/2018   GLUCOSE 108 (H) 05/16/2018   BUN 21 05/16/2018   CREATININE 0.88 05/16/2018   AST 18 05/16/2018   ALT 13 05/16/2018   CALCIUM 9.2 05/16/2018   GFR 71.60 05/16/2018   Lab Results  Component Value Date   CHOL 207 (H) 05/16/2018   Lab Results  Component Value Date   HDL 78.50 05/16/2018   Lab Results  Component Value Date   LDLCALC 110 (H) 05/16/2018   Lab Results  Component Value Date   TRIG 92.0 05/16/2018   Lab Results  Component Value Date   CHOLHDL 3 05/16/2018   No results found for: HGBA1C    Assessment & Plan:   Problem List Items Addressed This Visit       Cardiovascular and Mediastinum   Essential hypertension   Relevant Medications   lisinopril (ZESTRIL) 40 MG tablet   Other  Relevant Orders   Basic metabolic panel   CBC     Musculoskeletal and Integument   Primary osteoarthritis of both knees - Primary   Relevant  Orders   Ambulatory referral to Orthopedic Surgery    Meds ordered this encounter  Medications   lisinopril (ZESTRIL) 40 MG tablet    Sig: Take 1 tablet (40 mg total) by mouth daily.    Dispense:  90 tablet    Refill:  3    Follow-up: Return if symptoms worsen or fail to improve.  We discussed follow-up care for bilateral knee injections.  She will stay off her feet is much as possible over the next few days.  She may experience a steroid flare which could include some swelling and pain in her knees tomorrow.  With any fevers chills erythema and swelling in the knees that persist past tomorrow she is to be seen in the emergency room.  Have increase Zestril to 40 mg daily.  She will check her blood pressures once or twice weekly and follow-up with me in her neck scheduled appointment.  Information was given on hypertension and knee injections.  Libby Maw, MD

## 2021-03-10 LAB — BASIC METABOLIC PANEL
BUN: 20 mg/dL (ref 6–23)
CO2: 26 mEq/L (ref 19–32)
Calcium: 9.2 mg/dL (ref 8.4–10.5)
Chloride: 104 mEq/L (ref 96–112)
Creatinine, Ser: 0.8 mg/dL (ref 0.40–1.20)
GFR: 83.06 mL/min (ref >60.00)
Glucose, Bld: 86 mg/dL (ref 70–99)
Potassium: 4.2 mEq/L (ref 3.5–5.1)
Sodium: 139 mEq/L (ref 135–145)

## 2021-03-10 LAB — CBC
HCT: 37 % (ref 36.0–46.0)
Hemoglobin: 12.1 g/dL (ref 12.0–15.0)
MCHC: 32.7 g/dL (ref 30.0–36.0)
MCV: 89.1 fl (ref 78.0–100.0)
Platelets: 397 10*3/uL (ref 150.0–400.0)
RBC: 4.15 Mil/uL (ref 3.87–5.11)
RDW: 13.2 % (ref 11.5–15.5)
WBC: 7.5 10*3/uL (ref 4.0–10.5)

## 2021-05-04 ENCOUNTER — Encounter: Payer: Managed Care, Other (non HMO) | Admitting: Family Medicine

## 2021-05-09 ENCOUNTER — Encounter: Payer: Self-pay | Admitting: Family Medicine

## 2021-06-04 HISTORY — PX: TOTAL KNEE ARTHROPLASTY: SHX125

## 2021-06-06 ENCOUNTER — Encounter: Payer: Self-pay | Admitting: Family Medicine

## 2021-06-06 ENCOUNTER — Ambulatory Visit: Payer: Managed Care, Other (non HMO) | Admitting: Family Medicine

## 2021-06-06 ENCOUNTER — Other Ambulatory Visit: Payer: Self-pay

## 2021-06-06 VITALS — BP 142/70 | HR 82 | Temp 97.6°F | Ht 67.0 in | Wt 218.8 lb

## 2021-06-06 DIAGNOSIS — R87615 Unsatisfactory cytologic smear of cervix: Secondary | ICD-10-CM | POA: Diagnosis not present

## 2021-06-06 DIAGNOSIS — Z23 Encounter for immunization: Secondary | ICD-10-CM | POA: Diagnosis not present

## 2021-06-06 DIAGNOSIS — E559 Vitamin D deficiency, unspecified: Secondary | ICD-10-CM | POA: Diagnosis not present

## 2021-06-06 DIAGNOSIS — M170 Bilateral primary osteoarthritis of knee: Secondary | ICD-10-CM

## 2021-06-06 DIAGNOSIS — Z0000 Encounter for general adult medical examination without abnormal findings: Secondary | ICD-10-CM | POA: Diagnosis not present

## 2021-06-06 DIAGNOSIS — I10 Essential (primary) hypertension: Secondary | ICD-10-CM

## 2021-06-06 LAB — CBC
HCT: 33.7 % — ABNORMAL LOW (ref 36.0–46.0)
Hemoglobin: 10.9 g/dL — ABNORMAL LOW (ref 12.0–15.0)
MCHC: 32.5 g/dL (ref 30.0–36.0)
MCV: 88.3 fl (ref 78.0–100.0)
Platelets: 361 10*3/uL (ref 150.0–400.0)
RBC: 3.82 Mil/uL — ABNORMAL LOW (ref 3.87–5.11)
RDW: 13.8 % (ref 11.5–15.5)
WBC: 6 10*3/uL (ref 4.0–10.5)

## 2021-06-06 LAB — URINALYSIS, ROUTINE W REFLEX MICROSCOPIC
Bilirubin Urine: NEGATIVE
Hgb urine dipstick: NEGATIVE
Ketones, ur: NEGATIVE
Leukocytes,Ua: NEGATIVE
Nitrite: NEGATIVE
Specific Gravity, Urine: >=1.03 — AB (ref 1.000–1.030)
Total Protein, Urine: NEGATIVE
Urine Glucose: NEGATIVE
Urobilinogen, UA: 0.2 (ref 0.0–1.0)
pH: 6 (ref 5.0–8.0)

## 2021-06-06 LAB — COMPREHENSIVE METABOLIC PANEL
ALT: 11 U/L (ref 0–35)
AST: 14 U/L (ref 0–37)
Albumin: 3.7 g/dL (ref 3.5–5.2)
Alkaline Phosphatase: 44 U/L (ref 39–117)
BUN: 23 mg/dL (ref 6–23)
CO2: 26 mEq/L (ref 19–32)
Calcium: 8.8 mg/dL (ref 8.4–10.5)
Chloride: 107 mEq/L (ref 96–112)
Creatinine, Ser: 0.86 mg/dL (ref 0.40–1.20)
GFR: 76.03 mL/min (ref >60.00)
Glucose, Bld: 92 mg/dL (ref 70–99)
Potassium: 4.9 mEq/L (ref 3.5–5.1)
Sodium: 140 mEq/L (ref 135–145)
Total Bilirubin: 0.4 mg/dL (ref 0.2–1.2)
Total Protein: 6.3 g/dL (ref 6.0–8.3)

## 2021-06-06 LAB — LIPID PANEL
Cholesterol: 211 mg/dL — ABNORMAL HIGH (ref 0–200)
HDL: 71.3 mg/dL (ref >39.00)
LDL Cholesterol: 122 mg/dL — ABNORMAL HIGH (ref 0–99)
NonHDL: 139.61
Total CHOL/HDL Ratio: 3
Triglycerides: 86 mg/dL (ref 0.0–149.0)
VLDL: 17.2 mg/dL (ref 0.0–40.0)

## 2021-06-06 LAB — VITAMIN D 25 HYDROXY (VIT D DEFICIENCY, FRACTURES): VITD: <7 ng/mL — ABNORMAL LOW (ref 30.00–100.00)

## 2021-06-06 MED ORDER — TRAMADOL HCL 50 MG PO TABS: ORAL_TABLET | ORAL | 0 refills | Status: DC

## 2021-06-06 NOTE — Progress Notes (Addendum)
Established Patient Office Visit  Subjective:  Patient ID: Cindy Boyer, female    DOB: 07-13-1965  Age: 56 y.o. MRN: [id number]  CC:  Chief Complaint  Patient presents with   Transitions Of Care    TOC from Dr. Bryan Lemma, no concerns.     HPI Cindy Boyer presents for a health check and follow-up of her hypertension that has been controlled with lisinopril 40 mg.  Tolerating attention well.  Denies issues with it.  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  She is scheduled for knee replacement at the end of this month and then 4 weeks later she will have the other knee replaced.  History of abnormal Pap smear from a few years ago that needs follow-up.  She is due for colonoscopy.  Due for mammogram.  History of vitamin D deficiency.  She has been supplementing with 2000 IUs daily.  Past Medical History:  Diagnosis Date   Bronchitis    HISTORY AS CHILD, NO PROBLEMS AS ADULT    Cancer ([place])    Cervical   Environmental allergies     Past Surgical History:  Procedure Laterality Date   ABDOMINAL HYSTERECTOMY  2012   BREAST BIOPSY  2018   CERVICAL CONIZATION W/BX  05/14/2011   Procedure: CONIZATION CERVIX WITH BIOPSY;  Surgeon: Catha Brow;  Location: [facility];  Service: Gynecology;  Laterality: N/A;   COLPOSCOPY     SVD     X 1   WISDOM TOOTH EXTRACTION      Family History  Problem Relation Age of Onset   Breast cancer Mother    Arthritis Mother    Depression Father    Alcohol abuse Father    Depression Brother    Arthritis Maternal Grandmother    Arthritis Brother     Social History   Socioeconomic History   Marital status: Married    Spouse name: Not on file   Number of children: Not on file   Years of education: Not on file   Highest education level: Not on file  Occupational History   Not on file  Tobacco Use   Smoking status: Former    Packs/day: 1.00    Years: 26.00    Pack years: 26.00    Types: Cigarettes    Quit date: 08/20/2017    Years since  quitting: 3.8   Smokeless tobacco: Never  Substance and Sexual Activity   Alcohol use: No   Drug use: No   Sexual activity: Yes    Birth control/protection: None    Comment: HUSBAND VASECTOMY  Other Topics Concern   Not on file  Social History Narrative   She works at [employer] in [employer], married with 2 children, 3 granddaughters, 1 great granddaughter.    Social Determinants of Health   Financial Resource Strain: Not on file  Food Insecurity: Not on file  Transportation Needs: Not on file  Physical Activity: Not on file  Stress: Not on file  Social Connections: Not on file  Intimate Partner Violence: Not on file    Outpatient Medications Prior to Visit  Medication Sig Dispense Refill   Aspirin-Salicylamide-Caffeine (BC HEADACHE POWDER PO) Take by mouth.     calcium carbonate (TUMS - DOSED IN MG ELEMENTAL CALCIUM) 500 MG chewable tablet Chew 1 tablet by mouth daily.     cetirizine (ZYRTEC) 10 MG tablet Take 10 mg by mouth daily.     Cholecalciferol (VITAMIN D) 50 MCG (2000 UT) CAPS  Take 1 capsule by mouth daily.     ibuprofen (ADVIL) 200 MG tablet Take 200 mg by mouth every 6 (six) hours as needed for moderate pain.     lisinopril (ZESTRIL) 40 MG tablet Take 1 tablet (40 mg total) by mouth daily. 90 tablet 3   Multiple Vitamin (MULTIVITAMIN) capsule Take 1 capsule by mouth daily.     No facility-administered medications prior to visit.    Allergies  Allergen Reactions   Penicillins Itching, Swelling and Rash    Did it involve swelling of the face/tongue/throat, SOB, or low BP? No Did it involve sudden or severe rash/hives, skin peeling, or any reaction on the inside of your mouth or nose? Yes Did you need to seek medical attention at a hospital or doctor's office? Yes When did it last happen?  56 years old      If all above answers are NO, may proceed with cephalosporin use.    ROS Review of Systems  Constitutional:  Negative for diaphoresis, fatigue, fever  and unexpected weight change.  HENT: Negative.    Eyes:  Negative for photophobia and visual disturbance.  Respiratory: Negative.    Cardiovascular: Negative.   Gastrointestinal: Negative.   Endocrine: Negative for polyphagia and polyuria.  Genitourinary: Negative.   Musculoskeletal:  Positive for gait problem and joint swelling.  Skin: Negative.   Neurological:  Negative for speech difficulty and weakness.  Hematological: Negative.   Psychiatric/Behavioral: Negative.       Objective:    Physical Exam  BP (!) 142/70 (BP Location: Left Arm, Patient Position: Sitting, Cuff Size: Large)    Pulse 82    Temp 97.6 F (36.4 C) (Temporal)    Ht 5\' 7"  (1.702 m)    Wt 218 lb 12.8 oz (99.2 kg)    LMP 03/21/2011    SpO2 96%    BMI 34.27 kg/m  Wt Readings from Last 3 Encounters:  06/06/21 218 lb 12.8 oz (99.2 kg)  03/09/21 213 lb (96.6 kg)  01/06/20 217 lb 3.2 oz (98.5 kg)     Health Maintenance Due  Topic Date Due   COLONOSCOPY (Pts 45-65yrs Insurance coverage will need to be confirmed)  Never done   MAMMOGRAM  02/06/2019   PAP SMEAR-Modifier  05/16/2021    There are no preventive care reminders to display for this patient.  No results found for: TSH Lab Results  Component Value Date   WBC 6.0 06/06/2021   HGB 10.9 (L) 06/06/2021   HCT 33.7 (L) 06/06/2021   MCV 88.3 06/06/2021   PLT 361.0 06/06/2021   Lab Results  Component Value Date   NA 140 06/06/2021   K 4.9 06/06/2021   CO2 26 06/06/2021   GLUCOSE 92 06/06/2021   BUN 23 06/06/2021   CREATININE 0.86 06/06/2021   BILITOT 0.4 06/06/2021   ALKPHOS 44 06/06/2021   AST 14 06/06/2021   ALT 11 06/06/2021   PROT 6.3 06/06/2021   ALBUMIN 3.7 06/06/2021   CALCIUM 8.8 06/06/2021   GFR 76.03 06/06/2021   Lab Results  Component Value Date   CHOL 211 (H) 06/06/2021   Lab Results  Component Value Date   HDL 71.30 06/06/2021   Lab Results  Component Value Date   LDLCALC 122 (H) 06/06/2021   Lab Results  Component  Value Date   TRIG 86.0 06/06/2021   Lab Results  Component Value Date   CHOLHDL 3 06/06/2021   No results found for: HGBA1C    Assessment &  Plan:   Problem List Items Addressed This Visit       Cardiovascular and Mediastinum   Essential hypertension   Relevant Orders   CBC (Completed)   Comprehensive metabolic panel (Completed)   Urinalysis, Routine w reflex microscopic (Completed)     Musculoskeletal and Integument   Primary osteoarthritis of both knees - Primary   Relevant Medications   traMADol (ULTRAM) 50 MG tablet     Other   Unsatisfactory cervical Papanicolaou smear   Relevant Orders   Ambulatory referral to Gynecology   Healthcare maintenance   Relevant Orders   Hepatitis C antibody (Completed)   HIV Antibody (routine testing w rflx) (Completed)   Lipid panel (Completed)   Urinalysis, Routine w reflex microscopic (Completed)   Flu Vaccine QUAD 6+ mos PF IM (Fluarix Quad PF) (Completed)   Ambulatory referral to Gastroenterology   MM Digital Screening   Other Visit Diagnoses     Vitamin D deficiency       Relevant Medications   Vitamin D, Ergocalciferol, (DRISDOL) 1.25 MG (50000 UNIT) CAPS capsule   Other Relevant Orders   VITAMIN D 25 Hydroxy (Vit-D Deficiency, Fractures) (Completed)       Meds ordered this encounter  Medications   traMADol (ULTRAM) 50 MG tablet    Sig: May take one nightly as needed for pain.    Dispense:  30 tablet    Refill:  0   Vitamin D, Ergocalciferol, (DRISDOL) 1.25 MG (50000 UNIT) CAPS capsule    Sig: Take 1 capsule (50,000 Units total) by mouth every 7 (seven) days.    Dispense:  12 capsule    Refill:  1    Follow-up: Return in about 3 months (around 09/04/2021).  Wished her good luck with her surgeries.  Agrees that her extended time away from work would be a great time to catch up on her health maintenance issues.  Libby Maw, MD

## 2021-06-07 LAB — HEPATITIS C ANTIBODY
Hepatitis C Ab: NONREACTIVE
SIGNAL TO CUT-OFF: 0.03 (ref <1.00)

## 2021-06-07 LAB — HIV ANTIBODY (ROUTINE TESTING W REFLEX): HIV 1&2 Ab, 4th Generation: NONREACTIVE

## 2021-06-08 ENCOUNTER — Telehealth: Payer: Self-pay

## 2021-06-08 MED ORDER — VITAMIN D (ERGOCALCIFEROL) 1.25 MG (50000 UNIT) PO CAPS: 50000.0000 [IU] | ORAL_CAPSULE | ORAL | 1 refills | Status: DC

## 2021-06-08 NOTE — Telephone Encounter (Signed)
[facility] referring for repeat pap smear. Female providers only. Attempt to reach patient via phone. Voicemail is not set up unable to leave message.

## 2021-06-08 NOTE — Addendum Note (Signed)
Addended by: Jon Billings on: 06/08/2021 12:53 PM   Modules accepted: Orders

## 2021-06-09 NOTE — Telephone Encounter (Signed)
Attempt to reach patient via phone. Voicemail is not set up unable to leave message. Patient is aware via mychart to contact our office so scheduled

## 2021-06-15 NOTE — Telephone Encounter (Signed)
Patient states she is having both of her knees replaced and wants to scheduled for may. May's scheduled is currently not out. I will defer this referral until them

## 2021-06-15 NOTE — Telephone Encounter (Signed)
Called and left voicemail for patient to call back to be scheduled. 

## 2021-06-17 ENCOUNTER — Telehealth (HOSPITAL_BASED_OUTPATIENT_CLINIC_OR_DEPARTMENT_OTHER): Payer: Self-pay

## 2021-06-22 ENCOUNTER — Encounter: Payer: Self-pay | Admitting: Family Medicine

## 2021-06-22 ENCOUNTER — Ambulatory Visit (INDEPENDENT_AMBULATORY_CARE_PROVIDER_SITE_OTHER): Payer: Managed Care, Other (non HMO) | Admitting: Family Medicine

## 2021-06-22 ENCOUNTER — Telehealth: Payer: Self-pay | Admitting: Family Medicine

## 2021-06-22 VITALS — Temp 100.7°F | Ht 67.0 in | Wt 218.0 lb

## 2021-06-22 DIAGNOSIS — J4521 Mild intermittent asthma with (acute) exacerbation: Secondary | ICD-10-CM

## 2021-06-22 DIAGNOSIS — B349 Viral infection, unspecified: Secondary | ICD-10-CM

## 2021-06-22 MED ORDER — BENZONATATE 200 MG PO CAPS
200.0000 mg | ORAL_CAPSULE | Freq: Two times a day (BID) | ORAL | 0 refills | Status: DC | PRN
Start: 2021-06-22 — End: 2022-05-04

## 2021-06-22 MED ORDER — PREDNISONE 20 MG PO TABS: 20.0000 mg | ORAL_TABLET | Freq: Two times a day (BID) | ORAL | 0 refills | Status: AC

## 2021-06-22 MED ORDER — ALBUTEROL SULFATE HFA 108 (90 BASE) MCG/ACT IN AERS: 2.0000 | INHALATION_SPRAY | Freq: Four times a day (QID) | RESPIRATORY_TRACT | 2 refills | Status: DC | PRN

## 2021-06-22 MED ORDER — ALBUTEROL SULFATE HFA 108 (90 BASE) MCG/ACT IN AERS: 1.0000 | INHALATION_SPRAY | Freq: Four times a day (QID) | RESPIRATORY_TRACT | 2 refills | Status: DC | PRN

## 2021-06-22 NOTE — Telephone Encounter (Signed)
Pt did not show for her appointment on 06/22/21.

## 2021-06-22 NOTE — Progress Notes (Signed)
Established Patient Office Visit  Subjective:  Patient ID: Cindy Boyer, female    DOB: Oct 15, 1965  Age: 56 y.o. MRN: [id number]  CC:  Chief Complaint  Patient presents with   Cough    Cough, sore throat, horse voice, nasal and chest congestion, fever symptoms x 3 days OTC medications not helping. Little chest pains when coughing.     HPI Cindy Boyer presents for 4 day ho laryngitis, sore throat, cough, phlegm, facial pressure, head ache, increased temp to 100. Using otc cough and cough meds. There is fatigue. Negative Covid test x 2. Exposed to RSV at work. Has malaise. Some wheeze.   Past Medical History:  Diagnosis Date   Bronchitis    HISTORY AS CHILD, NO PROBLEMS AS ADULT    Cancer ([place])    Cervical   Environmental allergies     Past Surgical History:  Procedure Laterality Date   ABDOMINAL HYSTERECTOMY  2012   BREAST BIOPSY  2018   CERVICAL CONIZATION W/BX  05/14/2011   Procedure: CONIZATION CERVIX WITH BIOPSY;  Surgeon: Catha Brow;  Location: [facility];  Service: Gynecology;  Laterality: N/A;   COLPOSCOPY     SVD     X 1   WISDOM TOOTH EXTRACTION      Family History  Problem Relation Age of Onset   Breast cancer Mother    Arthritis Mother    Depression Father    Alcohol abuse Father    Depression Brother    Arthritis Maternal Grandmother    Arthritis Brother     Social History   Socioeconomic History   Marital status: Married    Spouse name: Not on file   Number of children: Not on file   Years of education: Not on file   Highest education level: Not on file  Occupational History   Not on file  Tobacco Use   Smoking status: Former    Packs/day: 1.00    Years: 26.00    Pack years: 26.00    Types: Cigarettes    Quit date: 08/20/2017    Years since quitting: 3.8   Smokeless tobacco: Never  Substance and Sexual Activity   Alcohol use: No   Drug use: No   Sexual activity: Yes    Birth control/protection: None    Comment: HUSBAND VASECTOMY   Other Topics Concern   Not on file  Social History Narrative   She works at [employer] in [employer], married with 2 children, 3 granddaughters, 1 great granddaughter.    Social Determinants of Health   Financial Resource Strain: Not on file  Food Insecurity: Not on file  Transportation Needs: Not on file  Physical Activity: Not on file  Stress: Not on file  Social Connections: Not on file  Intimate Partner Violence: Not on file    Outpatient Medications Prior to Visit  Medication Sig Dispense Refill   calcium carbonate (TUMS - DOSED IN MG ELEMENTAL CALCIUM) 500 MG chewable tablet Chew 1 tablet by mouth daily.     cetirizine (ZYRTEC) 10 MG tablet Take 10 mg by mouth daily.     FEROSUL 325 (65 Fe) MG tablet Take by mouth.     ibuprofen (ADVIL) 200 MG tablet Take 200 mg by mouth every 6 (six) hours as needed for moderate pain.     lisinopril (ZESTRIL) 40 MG tablet Take 1 tablet (40 mg total) by mouth daily. 90 tablet 3   Multiple Vitamin (MULTIVITAMIN) capsule Take 1 capsule  by mouth daily.     Vitamin D, Ergocalciferol, (DRISDOL) 1.25 MG (50000 UNIT) CAPS capsule Take 1 capsule (50,000 Units total) by mouth every 7 (seven) days. 12 capsule 1   Cholecalciferol (VITAMIN D) 50 MCG (2000 UT) CAPS Take 1 capsule by mouth daily.     traMADol (ULTRAM) 50 MG tablet May take one nightly as needed for pain. (Patient not taking: Reported on 06/22/2021) 30 tablet 0   Aspirin-Salicylamide-Caffeine (BC HEADACHE POWDER PO) Take by mouth. (Patient not taking: Reported on 06/22/2021)     No facility-administered medications prior to visit.    Allergies  Allergen Reactions   Penicillins Itching, Swelling and Rash    Did it involve swelling of the face/tongue/throat, SOB, or low BP? No Did it involve sudden or severe rash/hives, skin peeling, or any reaction on the inside of your mouth or nose? Yes Did you need to seek medical attention at a hospital or doctor's office? Yes When did it last  happen?  56 years old      If all above answers are NO, may proceed with cephalosporin use.    ROS Review of Systems  Constitutional:  Positive for fatigue. Negative for chills, diaphoresis, fever and unexpected weight change.  HENT:  Positive for congestion, postnasal drip, rhinorrhea, sinus pressure, sinus pain, sore throat and voice change. Negative for trouble swallowing.   Eyes:  Negative for photophobia and visual disturbance.  Respiratory:  Positive for cough and wheezing. Negative for shortness of breath.   Gastrointestinal:  Positive for diarrhea. Negative for nausea and vomiting.  Genitourinary: Negative.   Musculoskeletal:  Negative for arthralgias and myalgias.  Psychiatric/Behavioral: Negative.       Objective:    Physical Exam Vitals and nursing note reviewed.  Constitutional:      General: She is not in acute distress.    Appearance: Normal appearance. She is not ill-appearing, toxic-appearing or diaphoretic.  HENT:     Head: Normocephalic and atraumatic.  Eyes:     General: No scleral icterus.       Right eye: No discharge.        Left eye: No discharge.     Extraocular Movements: Extraocular movements intact.     Conjunctiva/sclera: Conjunctivae normal.  Pulmonary:     Effort: Pulmonary effort is normal.  Neurological:     Mental Status: She is alert and oriented to person, place, and time.  Psychiatric:        Mood and Affect: Mood normal.        Behavior: Behavior normal.    Temp (!) 100.7 F (38.2 C) (Oral)    Ht 5\' 7"  (1.702 m)    Wt 218 lb (98.9 kg)    LMP 03/21/2011    BMI 34.14 kg/m  Wt Readings from Last 3 Encounters:  06/22/21 218 lb (98.9 kg)  06/06/21 218 lb 12.8 oz (99.2 kg)  03/09/21 213 lb (96.6 kg)     Health Maintenance Due  Topic Date Due   COLONOSCOPY (Pts 45-68yrs Insurance coverage will need to be confirmed)  Never done   MAMMOGRAM  02/06/2019   PAP SMEAR-Modifier  05/16/2021    There are no preventive care reminders to  display for this patient.  No results found for: TSH Lab Results  Component Value Date   WBC 6.0 06/06/2021   HGB 10.9 (L) 06/06/2021   HCT 33.7 (L) 06/06/2021   MCV 88.3 06/06/2021   PLT 361.0 06/06/2021   Lab Results  Component Value  Date   NA 140 06/06/2021   K 4.9 06/06/2021   CO2 26 06/06/2021   GLUCOSE 92 06/06/2021   BUN 23 06/06/2021   CREATININE 0.86 06/06/2021   BILITOT 0.4 06/06/2021   ALKPHOS 44 06/06/2021   AST 14 06/06/2021   ALT 11 06/06/2021   PROT 6.3 06/06/2021   ALBUMIN 3.7 06/06/2021   CALCIUM 8.8 06/06/2021   GFR 76.03 06/06/2021   Lab Results  Component Value Date   CHOL 211 (H) 06/06/2021   Lab Results  Component Value Date   HDL 71.30 06/06/2021   Lab Results  Component Value Date   LDLCALC 122 (H) 06/06/2021   Lab Results  Component Value Date   TRIG 86.0 06/06/2021   Lab Results  Component Value Date   CHOLHDL 3 06/06/2021   No results found for: HGBA1C    Assessment & Plan:   Problem List Items Addressed This Visit       Other   Viral syndrome - Primary   Relevant Orders   COVID-19, Flu A+B and RSV   Other Visit Diagnoses     Mild intermittent reactive airway disease with acute exacerbation       Relevant Medications   predniSONE (DELTASONE) 20 MG tablet   benzonatate (TESSALON) 200 MG capsule   albuterol (VENTOLIN HFA) 108 (90 Base) MCG/ACT inhaler       Meds ordered this encounter  Medications   DISCONTD: albuterol (VENTOLIN HFA) 108 (90 Base) MCG/ACT inhaler    Sig: Inhale 2 puffs into the lungs every 6 (six) hours as needed for wheezing or shortness of breath.    Dispense:  8 g    Refill:  2   predniSONE (DELTASONE) 20 MG tablet    Sig: Take 1 tablet (20 mg total) by mouth 2 (two) times daily with a meal for 7 days.    Dispense:  14 tablet    Refill:  0   benzonatate (TESSALON) 200 MG capsule    Sig: Take 1 capsule (200 mg total) by mouth 2 (two) times daily as needed for cough.    Dispense:  20 capsule     Refill:  0   albuterol (VENTOLIN HFA) 108 (90 Base) MCG/ACT inhaler    Sig: Inhale 1-2 puffs into the lungs every 6 (six) hours as needed for wheezing or shortness of breath.    Dispense:  8 g    Refill:  2    Follow-up: Return in about 1 week (around 06/29/2021), or if symptoms worsen or fail to improve.   Out of work through Sunday.  Come by tomorrow morning for a nasal swab.  Follow-up next week if not improving.  Explained how to use albuterol inhaler for wheezing and/or cough. Libby Maw, MD  Virtual Visit via Video Note  I connected with Cindy Boyer on 06/22/21 at  4:00 PM EST by a video enabled telemedicine application and verified that I am speaking with the correct person using two identifiers.  Location: Patient: home alone.  Provider: work   I discussed the limitations of evaluation and management by telemedicine and the availability of in person appointments. The patient expressed understanding and agreed to proceed.  History of Present Illness:    Observations/Objective:   Assessment and Plan:   Follow Up Instructions:    I discussed the assessment and treatment plan with the patient. The patient was provided an opportunity to ask questions and all were answered. The patient agreed with the plan  and demonstrated an understanding of the instructions.   The patient was advised to call back or seek an in-person evaluation if the symptoms worsen or if the condition fails to improve as anticipated.  I provided 20 minutes of non-face-to-face time during this encounter.   Libby Maw, MD

## 2021-06-23 ENCOUNTER — Other Ambulatory Visit: Payer: Managed Care, Other (non HMO)

## 2021-06-23 ENCOUNTER — Other Ambulatory Visit: Payer: Self-pay

## 2021-06-23 DIAGNOSIS — B349 Viral infection, unspecified: Secondary | ICD-10-CM

## 2021-06-25 LAB — COVID-19, FLU A+B AND RSV
Influenza A, NAA: NOT DETECTED
Influenza B, NAA: NOT DETECTED
RSV, NAA: NOT DETECTED
SARS-CoV-2, NAA: NOT DETECTED

## 2021-06-29 NOTE — Telephone Encounter (Signed)
Pt completed video visit with Dr. Ethelene Hal 06/22/21

## 2021-07-22 ENCOUNTER — Telehealth (HOSPITAL_BASED_OUTPATIENT_CLINIC_OR_DEPARTMENT_OTHER): Payer: Self-pay

## 2021-08-03 ENCOUNTER — Other Ambulatory Visit: Payer: Self-pay

## 2021-08-03 ENCOUNTER — Other Ambulatory Visit (INDEPENDENT_AMBULATORY_CARE_PROVIDER_SITE_OTHER): Payer: Managed Care, Other (non HMO)

## 2021-08-03 DIAGNOSIS — Z0000 Encounter for general adult medical examination without abnormal findings: Secondary | ICD-10-CM | POA: Diagnosis not present

## 2021-08-03 LAB — URINALYSIS, ROUTINE W REFLEX MICROSCOPIC
Bilirubin Urine: NEGATIVE
Hgb urine dipstick: NEGATIVE
Ketones, ur: NEGATIVE
Leukocytes,Ua: NEGATIVE
Nitrite: NEGATIVE
RBC / HPF: NONE SEEN (ref 0–?)
Specific Gravity, Urine: 1.01 (ref 1.000–1.030)
Total Protein, Urine: NEGATIVE
Urine Glucose: NEGATIVE
Urobilinogen, UA: 0.2 (ref 0.0–1.0)
pH: 7 (ref 5.0–8.0)

## 2021-08-03 LAB — COMPREHENSIVE METABOLIC PANEL
ALT: 11 U/L (ref 0–35)
AST: 15 U/L (ref 0–37)
Albumin: 4 g/dL (ref 3.5–5.2)
Alkaline Phosphatase: 54 U/L (ref 39–117)
BUN: 12 mg/dL (ref 6–23)
CO2: 27 mEq/L (ref 19–32)
Calcium: 9.6 mg/dL (ref 8.4–10.5)
Chloride: 103 mEq/L (ref 96–112)
Creatinine, Ser: 0.73 mg/dL (ref 0.40–1.20)
GFR: 92.45 mL/min (ref >60.00)
Glucose, Bld: 87 mg/dL (ref 70–99)
Potassium: 4.1 mEq/L (ref 3.5–5.1)
Sodium: 139 mEq/L (ref 135–145)
Total Bilirubin: 0.6 mg/dL (ref 0.2–1.2)
Total Protein: 7.1 g/dL (ref 6.0–8.3)

## 2021-08-03 LAB — CBC
HCT: 34 % — ABNORMAL LOW (ref 36.0–46.0)
Hemoglobin: 11.1 g/dL — ABNORMAL LOW (ref 12.0–15.0)
MCHC: 32.5 g/dL (ref 30.0–36.0)
MCV: 87.9 fl (ref 78.0–100.0)
Platelets: 453 10*3/uL — ABNORMAL HIGH (ref 150.0–400.0)
RBC: 3.87 Mil/uL (ref 3.87–5.11)
RDW: 15.1 % (ref 11.5–15.5)
WBC: 7.7 10*3/uL (ref 4.0–10.5)

## 2021-08-03 LAB — LIPID PANEL
Cholesterol: 190 mg/dL (ref 0–200)
HDL: 61.5 mg/dL (ref >39.00)
LDL Cholesterol: 99 mg/dL (ref 0–99)
NonHDL: 128.31
Total CHOL/HDL Ratio: 3
Triglycerides: 148 mg/dL (ref 0.0–149.0)
VLDL: 29.6 mg/dL (ref 0.0–40.0)

## 2021-08-03 LAB — VITAMIN D 25 HYDROXY (VIT D DEFICIENCY, FRACTURES): VITD: 16.93 ng/mL — ABNORMAL LOW (ref 30.00–100.00)

## 2021-09-07 ENCOUNTER — Telehealth: Payer: Self-pay | Admitting: Family Medicine

## 2021-09-07 ENCOUNTER — Ambulatory Visit: Payer: Managed Care, Other (non HMO) | Admitting: Family Medicine

## 2021-09-07 NOTE — Telephone Encounter (Signed)
Pt did not show up for her OV with Dr. Ethelene Hal on 09/07/21, I sent a no show letter.  ?

## 2021-09-08 ENCOUNTER — Other Ambulatory Visit: Payer: Self-pay | Admitting: Family Medicine

## 2021-09-08 DIAGNOSIS — E559 Vitamin D deficiency, unspecified: Secondary | ICD-10-CM

## 2021-09-14 ENCOUNTER — Encounter: Payer: Self-pay | Admitting: Family Medicine

## 2021-10-02 NOTE — Telephone Encounter (Signed)
Called and left voicemail for patient to call back to be scheduled. 

## 2021-10-04 NOTE — Telephone Encounter (Signed)
Attempt to reach patient via phone. Voice mail is not accepting messages at this time.

## 2022-02-23 ENCOUNTER — Other Ambulatory Visit: Payer: Self-pay | Admitting: Family Medicine

## 2022-02-23 DIAGNOSIS — E559 Vitamin D deficiency, unspecified: Secondary | ICD-10-CM

## 2022-03-04 ENCOUNTER — Other Ambulatory Visit: Payer: Self-pay | Admitting: Family Medicine

## 2022-03-04 DIAGNOSIS — I10 Essential (primary) hypertension: Secondary | ICD-10-CM

## 2022-05-04 ENCOUNTER — Telehealth: Payer: Managed Care, Other (non HMO) | Admitting: Family Medicine

## 2022-05-04 DIAGNOSIS — B9789 Other viral agents as the cause of diseases classified elsewhere: Secondary | ICD-10-CM | POA: Diagnosis not present

## 2022-05-04 DIAGNOSIS — J019 Acute sinusitis, unspecified: Secondary | ICD-10-CM | POA: Diagnosis not present

## 2022-05-04 DIAGNOSIS — J069 Acute upper respiratory infection, unspecified: Secondary | ICD-10-CM | POA: Diagnosis not present

## 2022-05-04 MED ORDER — FLUTICASONE PROPIONATE 50 MCG/ACT NA SUSP: 2.0000 | Freq: Every day | NASAL | 0 refills | Status: DC

## 2022-05-04 MED ORDER — BENZONATATE 100 MG PO CAPS: 100.0000 mg | ORAL_CAPSULE | Freq: Two times a day (BID) | ORAL | 0 refills | Status: DC | PRN

## 2022-05-04 MED ORDER — ALBUTEROL SULFATE HFA 108 (90 BASE) MCG/ACT IN AERS: 2.0000 | INHALATION_SPRAY | Freq: Four times a day (QID) | RESPIRATORY_TRACT | 0 refills | Status: DC | PRN

## 2022-05-04 MED ORDER — PREDNISONE 20 MG PO TABS: 40.0000 mg | ORAL_TABLET | Freq: Every day | ORAL | 0 refills | Status: AC

## 2022-05-04 NOTE — Progress Notes (Signed)
E-Visit for Upper Respiratory Infection   We are sorry you are not feeling well.  Here is how we plan to help!  Based on what you have shared with me, it looks like you may have a viral upper respiratory infection.  Upper respiratory infections are caused by a large number of viruses; however, rhinovirus is the most common cause.   Symptoms vary from person to person, with common symptoms including sore throat, cough, fatigue or lack of energy and feeling of general discomfort.  A low-grade fever of up to 100.4 may present, but is often uncommon.  Symptoms vary however, and are closely related to a person's age or underlying illnesses.  The most common symptoms associated with an upper respiratory infection are nasal discharge or congestion, cough, sneezing, headache and pressure in the ears and face.  These symptoms usually persist for about 3 to 10 days, but can last up to 2 weeks.  It is important to know that upper respiratory infections do not cause serious illness or complications in most cases.    Upper respiratory infections can be transmitted from person to person, with the most common method of transmission being a person's hands.  The virus is able to live on the skin and can infect other persons for up to 2 hours after direct contact.  Also, these can be transmitted when someone coughs or sneezes; thus, it is important to cover the mouth to reduce this risk.  To keep the spread of the illness at [place], good hand hygiene is very important.  This is an infection that is most likely caused by a virus. There are no specific treatments other than to help you with the symptoms until the infection runs its course.  We are sorry you are not feeling well.  Here is how we plan to help!   For nasal congestion, you may use an oral decongestants such as Mucinex D or if you have glaucoma or high blood pressure use plain Mucinex.  Saline nasal spray or nasal drops can help and can safely be used as often as  needed for congestion.  For your congestion, I have prescribed Fluticasone nasal spray one spray in each nostril twice a day  If you do not have a history of heart disease, hypertension, diabetes or thyroid disease, prostate/bladder issues or glaucoma, you may also use Sudafed to treat nasal congestion.  It is highly recommended that you consult with a pharmacist or your primary care physician to ensure this medication is safe for you to take.     If you have a cough, you may use cough suppressants such as Delsym and Robitussin.  If you have glaucoma or high blood pressure, you can also use Coricidin HBP.   For cough I have prescribed for you A prescription cough medication called Tessalon Perles 100 mg. You may take 1-2 capsules every 8 hours as needed for cough  I will also order a short burst of prednisone 40 mg once daily for 3 days. In addition, an albuterol inhaler as well.  If you have a sore or scratchy throat, use a saltwater gargle-  to  teaspoon of salt dissolved in a 4-ounce to 8-ounce glass of warm water.  Gargle the solution for approximately 15-30 seconds and then spit.  It is important not to swallow the solution.  You can also use throat lozenges/cough drops and Chloraseptic spray to help with throat pain or discomfort.  Warm or cold liquids can also be helpful  in relieving throat pain.  For headache, pain or general discomfort, you can use Ibuprofen or Tylenol as directed.   Some authorities believe that zinc sprays or the use of Echinacea may shorten the course of your symptoms.   HOME CARE Only take medications as instructed by your medical team. Be sure to drink plenty of fluids. Water is fine as well as fruit juices, sodas and electrolyte beverages. You may want to stay away from caffeine or alcohol. If you are nauseated, try taking small sips of liquids. How do you know if you are getting enough fluid? Your urine should be a pale yellow or almost colorless. Get  rest. Taking a steamy shower or using a humidifier may help nasal congestion and ease sore throat pain. You can place a towel over your head and breathe in the steam from hot water coming from a faucet. Using a saline nasal spray works much the same way. Cough drops, hard candies and sore throat lozenges may ease your cough. Avoid close contacts especially the very young and the elderly Cover your mouth if you cough or sneeze Always remember to wash your hands.   GET HELP RIGHT AWAY IF: You develop worsening fever. If your symptoms do not improve within 10 days You develop yellow or green discharge from your nose over 3 days. You have coughing fits You develop a severe head ache or visual changes. You develop shortness of breath, difficulty breathing or start having chest pain Your symptoms persist after you have completed your treatment plan  MAKE SURE YOU  Understand these instructions. Will watch your condition. Will get help right away if you are not doing well or get worse.  Thank you for choosing an e-visit.  Your e-visit answers were reviewed by a board certified advanced clinical practitioner to complete your personal care plan. Depending upon the condition, your plan could have included both over the counter or prescription medications.  Please review your pharmacy choice. Make sure the pharmacy is open so you can pick up prescription now. If there is a problem, you may contact your provider through [employer] and have the prescription routed to another pharmacy.  Your safety is important to [place]. If you have drug allergies check your prescription carefully.   For the next 24 hours you can use MyChart to ask questions about today's visit, request a non-urgent call back, or ask for a work or school excuse. You will get an email in the next two days asking about your experience. I hope that your e-visit has been valuable and will speed your recovery.    I provided 5  minutes of non face-to-face time during this encounter for chart review, medication and order placement, as well as and documentation.

## 2022-05-11 ENCOUNTER — Ambulatory Visit (INDEPENDENT_AMBULATORY_CARE_PROVIDER_SITE_OTHER): Payer: Managed Care, Other (non HMO) | Admitting: Family Medicine

## 2022-05-11 ENCOUNTER — Encounter: Payer: Self-pay | Admitting: Family Medicine

## 2022-05-11 VITALS — BP 162/80 | HR 96 | Temp 98.2°F | Ht 67.0 in | Wt 234.4 lb

## 2022-05-11 DIAGNOSIS — E559 Vitamin D deficiency, unspecified: Secondary | ICD-10-CM

## 2022-05-11 DIAGNOSIS — R058 Other specified cough: Secondary | ICD-10-CM

## 2022-05-11 DIAGNOSIS — Z23 Encounter for immunization: Secondary | ICD-10-CM

## 2022-05-11 DIAGNOSIS — Z0000 Encounter for general adult medical examination without abnormal findings: Secondary | ICD-10-CM

## 2022-05-11 DIAGNOSIS — I10 Essential (primary) hypertension: Secondary | ICD-10-CM

## 2022-05-11 LAB — CBC WITH DIFFERENTIAL/PLATELET
Basophils Absolute: 0.1 10*3/uL (ref 0.0–0.1)
Basophils Relative: 0.8 % (ref 0.0–3.0)
Eosinophils Absolute: 0.5 10*3/uL (ref 0.0–0.7)
Eosinophils Relative: 4 % (ref 0.0–5.0)
HCT: 33.5 % — ABNORMAL LOW (ref 36.0–46.0)
Hemoglobin: 11.1 g/dL — ABNORMAL LOW (ref 12.0–15.0)
Lymphocytes Relative: 14.1 % (ref 12.0–46.0)
Lymphs Abs: 1.6 10*3/uL (ref 0.7–4.0)
MCHC: 33.2 g/dL (ref 30.0–36.0)
MCV: 89.2 fl (ref 78.0–100.0)
Monocytes Absolute: 0.7 10*3/uL (ref 0.1–1.0)
Monocytes Relative: 5.8 % (ref 3.0–12.0)
Neutro Abs: 8.7 10*3/uL — ABNORMAL HIGH (ref 1.4–7.7)
Neutrophils Relative %: 75.3 % (ref 43.0–77.0)
Platelets: 502 10*3/uL — ABNORMAL HIGH (ref 150.0–400.0)
RBC: 3.76 Mil/uL — ABNORMAL LOW (ref 3.87–5.11)
RDW: 14.3 % (ref 11.5–15.5)
WBC: 11.5 10*3/uL — ABNORMAL HIGH (ref 4.0–10.5)

## 2022-05-11 LAB — URINALYSIS, ROUTINE W REFLEX MICROSCOPIC
Bilirubin Urine: NEGATIVE
Ketones, ur: NEGATIVE
Leukocytes,Ua: NEGATIVE
Nitrite: NEGATIVE
Specific Gravity, Urine: 1.015 (ref 1.000–1.030)
Total Protein, Urine: NEGATIVE
Urine Glucose: NEGATIVE
Urobilinogen, UA: 0.2 (ref 0.0–1.0)
pH: 7 (ref 5.0–8.0)

## 2022-05-11 LAB — LIPID PANEL
Cholesterol: 172 mg/dL (ref 0–200)
HDL: 62.1 mg/dL (ref >39.00)
LDL Cholesterol: 87 mg/dL (ref 0–99)
NonHDL: 109.61
Total CHOL/HDL Ratio: 3
Triglycerides: 113 mg/dL (ref 0.0–149.0)
VLDL: 22.6 mg/dL (ref 0.0–40.0)

## 2022-05-11 LAB — COMPREHENSIVE METABOLIC PANEL
ALT: 16 U/L (ref 0–35)
AST: 16 U/L (ref 0–37)
Albumin: 3.7 g/dL (ref 3.5–5.2)
Alkaline Phosphatase: 70 U/L (ref 39–117)
BUN: 17 mg/dL (ref 6–23)
CO2: 30 mEq/L (ref 19–32)
Calcium: 9 mg/dL (ref 8.4–10.5)
Chloride: 104 mEq/L (ref 96–112)
Creatinine, Ser: 0.77 mg/dL (ref 0.40–1.20)
GFR: 86.25 mL/min (ref >60.00)
Glucose, Bld: 103 mg/dL — ABNORMAL HIGH (ref 70–99)
Potassium: 4.5 mEq/L (ref 3.5–5.1)
Sodium: 141 mEq/L (ref 135–145)
Total Bilirubin: 0.4 mg/dL (ref 0.2–1.2)
Total Protein: 6.8 g/dL (ref 6.0–8.3)

## 2022-05-11 LAB — VITAMIN D 25 HYDROXY (VIT D DEFICIENCY, FRACTURES): VITD: 16.53 ng/mL — ABNORMAL LOW (ref 30.00–100.00)

## 2022-05-11 MED ORDER — PREDNISONE 10 MG PO TABS: 10.0000 mg | ORAL_TABLET | Freq: Every day | ORAL | 0 refills | Status: AC

## 2022-05-11 NOTE — Progress Notes (Signed)
Established Patient Office Visit   Subjective:  Patient ID: Cindy Boyer, female    DOB: [birth year]  Age: 56 y.o. MRN: [id number]  Chief Complaint  Patient presents with   Annual Exam    CPE, no concerns. Patient fasting.     HPI Encounter Diagnoses  Name Primary?   Healthcare maintenance Yes   Vitamin D deficiency    Essential hypertension    Post-viral cough syndrome    Need for immunization against influenza    Fasting for health check and physical exam.  She is seen a dentist regularly.  She is able to exercise now after her knee replacement.  She stays active at work.  She is recovering from a recent URI that started 10 days ago.  Feeling better but remains congested in her nose and chest.  Cough is productive of clear phlegm and there is some wheezing.  She does not smoke.  She is taking DayQuil several times daily.   Review of Systems  Constitutional: Negative.   HENT: Negative.    Eyes:  Negative for blurred vision, discharge and redness.  Respiratory:  Positive for cough, sputum production and wheezing. Negative for shortness of breath.   Cardiovascular: Negative.   Gastrointestinal:  Negative for abdominal pain.  Genitourinary: Negative.   Musculoskeletal: Negative.  Negative for myalgias.  Skin:  Negative for rash.  Neurological:  Negative for tingling, loss of consciousness and weakness.  Endo/Heme/Allergies:  Negative for polydipsia.     Current Outpatient Medications:    albuterol (VENTOLIN HFA) 108 (90 Base) MCG/ACT inhaler, Inhale 2 puffs into the lungs every 6 (six) hours as needed for wheezing or shortness of breath., Disp: 8 g, Rfl: 0   benzonatate (TESSALON) 100 MG capsule, Take 1 capsule (100 mg total) by mouth 2 (two) times daily as needed for cough., Disp: 20 capsule, Rfl: 0   calcium carbonate (TUMS - DOSED IN MG ELEMENTAL CALCIUM) 500 MG chewable tablet, Chew 1 tablet by mouth daily., Disp: , Rfl:    cetirizine (ZYRTEC) 10 MG tablet, Take 10 mg  by mouth daily., Disp: , Rfl:    fluticasone (FLONASE) 50 MCG/ACT nasal spray, Place 2 sprays into both nostrils daily., Disp: 16 g, Rfl: 0   ibuprofen (ADVIL) 200 MG tablet, Take 200 mg by mouth every 6 (six) hours as needed for moderate pain., Disp: , Rfl:    lisinopril (ZESTRIL) 40 MG tablet, TAKE ONE TABLET BY MOUTH DAILY, Disp: 90 tablet, Rfl: 3   Multiple Vitamin (MULTIVITAMIN) capsule, Take 1 capsule by mouth daily., Disp: , Rfl:    predniSONE (DELTASONE) 10 MG tablet, Take 1 tablet (10 mg total) by mouth daily with breakfast for 7 days., Disp: 7 tablet, Rfl: 0   FEROSUL 325 (65 Fe) MG tablet, Take by mouth. (Patient not taking: Reported on 05/11/2022), Disp: , Rfl:    Vitamin D, Ergocalciferol, (DRISDOL) 1.25 MG (50000 UNIT) CAPS capsule, TAKE ONE CAPSULE BY MOUTH ONCE WEEKLY (Patient not taking: Reported on 05/11/2022), Disp: 12 capsule, Rfl: 1   Objective:     BP (!) 162/80 (BP Location: Left Arm, Patient Position: Sitting, Cuff Size: Large)   Pulse 96   Temp 98.2 F (36.8 C) (Temporal)   Ht '5\' 7"'$  (1.702 m)   Wt 234 lb 6.4 oz (106.3 kg)   LMP 03/21/2011   SpO2 96%   BMI 36.71 kg/m  BP Readings from Last 3 Encounters:  05/11/22 (!) 162/80  06/06/21 (!) 142/70  03/09/21 (!) 156/78  Wt Readings from Last 3 Encounters:  05/11/22 234 lb 6.4 oz (106.3 kg)  06/22/21 218 lb (98.9 kg)  06/06/21 218 lb 12.8 oz (99.2 kg)      Physical Exam Constitutional:      General: She is not in acute distress.    Appearance: Normal appearance. She is not ill-appearing, toxic-appearing or diaphoretic.  HENT:     Head: Normocephalic and atraumatic.     Right Ear: External ear normal.     Left Ear: External ear normal.     Mouth/Throat:     Mouth: Mucous membranes are moist.     Pharynx: Oropharynx is clear. No oropharyngeal exudate or posterior oropharyngeal erythema.  Eyes:     General: No scleral icterus.       Right eye: No discharge.        Left eye: No discharge.     Extraocular  Movements: Extraocular movements intact.     Conjunctiva/sclera: Conjunctivae normal.     Pupils: Pupils are equal, round, and reactive to light.  Cardiovascular:     Rate and Rhythm: Normal rate and regular rhythm.  Pulmonary:     Effort: Pulmonary effort is normal. No respiratory distress.     Breath sounds: Normal breath sounds.  Abdominal:     General: Bowel sounds are normal.  Musculoskeletal:     Cervical back: No rigidity or tenderness.  Skin:    General: Skin is warm and dry.  Neurological:     Mental Status: She is alert and oriented to person, place, and time.  Psychiatric:        Mood and Affect: Mood normal.        Behavior: Behavior normal.      No results found for any visits on 05/11/22.    The 10-year ASCVD risk score (Arnett DK, et al., 2019) is: 4.1%    Assessment & Plan:   Healthcare maintenance -     Lipid panel -     Urinalysis, Routine w reflex microscopic -     Ambulatory referral to Gastroenterology  Vitamin D deficiency -     VITAMIN D 25 Hydroxy (Vit-D Deficiency, Fractures)  Essential hypertension -     CBC with Differential/Platelet -     Urinalysis, Routine w reflex microscopic -     Comprehensive metabolic panel  Post-viral cough syndrome -     predniSONE; Take 1 tablet (10 mg total) by mouth daily with breakfast for 7 days.  Dispense: 7 tablet; Refill: 0  Need for immunization against influenza -     Flu Vaccine QUAD 32moIM (Fluarix, Fluzone & Alfiuria Quad PF)    Return in about 4 weeks (around 06/08/2022), or For follow-up of elevated blood pressure today..  Asked patient to back off on the DayQuil and just take 110 mg prednisone daily for a week.  Information was given on managing hypertension.  She will follow-up in 4 weeks for recheck of her blood pressure.  Blood pressure has been elevated.  May need to add medicines or change.  Rechecking vitamin D levels.  She is not currently on supplement.  Information was given on health  maintenance and disease prevention.  She is due for colonoscopy.   WLibby Maw MD

## 2022-05-14 ENCOUNTER — Other Ambulatory Visit: Payer: Self-pay

## 2022-05-14 DIAGNOSIS — E611 Iron deficiency: Secondary | ICD-10-CM

## 2022-05-14 MED ORDER — FEROSUL 325 (65 FE) MG PO TABS: 325.0000 mg | ORAL_TABLET | Freq: Every day | ORAL | 3 refills | Status: DC

## 2022-05-30 ENCOUNTER — Telehealth: Payer: Managed Care, Other (non HMO) | Admitting: Physician Assistant

## 2022-05-30 DIAGNOSIS — B86 Scabies: Secondary | ICD-10-CM | POA: Diagnosis not present

## 2022-05-30 MED ORDER — HYDROXYZINE PAMOATE 25 MG PO CAPS: 25.0000 mg | ORAL_CAPSULE | Freq: Three times a day (TID) | ORAL | 0 refills | Status: DC | PRN

## 2022-05-30 MED ORDER — PERMETHRIN 5 % EX CREA: 1.0000 | TOPICAL_CREAM | Freq: Once | CUTANEOUS | 0 refills | Status: AC

## 2022-05-30 NOTE — Progress Notes (Signed)
E-Visit for Scabies  We are sorry that you are not feeling well. Here is how we plan to help!  Based on what you shared with me it looks like you have scabies.  Scabies is an infection caused by very tiny mites that burrow into the skin.  They cause severe itching. Though children are most commonly infected, anyone can get scabies.  Scabies mites can pass from person to person through physical close contact.  They can also be passed through shared clothing, towels, and bedding.  Scabies infection is not usually dangerous, but it is uncomfortable.  Because it is so contagious, scabies should be treated immediately to keep the infection from spreading.  If you have children in day care, please have any exposed children examined by their pediatrician. .   I have prescribed Permethrin topical cream 5% thoroughly massage cream from head to soles of feet; leave on for 8 to 14 hours before removing (shower or bath). May repeat if living mites are observed 14 days after first treatment; one application is generally curative. I am also sending in a prescription for hydroxyzine to help with the itch.    HOME CARE:  You should treat all members in your household whether they show symptoms or not. Wash towels, clothes, bed linens, cloth toys, hats, and other personal items in hot water and dry on high heat. Vacuum floors and furniture and throw away the bag afterwards. Keep your child home from day care or school until the morning after treatment for scabies. Notify your child's day care or school so that other children can be checked and treated.  GET HELP RIGHT AWAY IF:  The infected person has a fever, red streaks, pain, or swelling of the skin. Sores get worse or don't heal. New rashes appear or itching continues for more than 2 weeks after treatment.  MAKE SURE YOU:  Understand these instructions. Will watch your condition. Will get help right away if you are not doing well or get  worse.  Thank you for choosing an e-visit.  Your e-visit answers were reviewed by a board certified advanced clinical practitioner to complete your personal care plan. Depending upon the condition, your plan could have included both over the counter or prescription medications.  Please review your pharmacy choice. Make sure the pharmacy is open so you can pick up prescription now. If there is a problem, you may contact your provider through [employer] and have the prescription routed to another pharmacy.  Your safety is important to [place]. If you have drug allergies check your prescription carefully.   For the next 24 hours you can use MyChart to ask questions about today's visit, request a non-urgent call back, or ask for a work or school excuse. You will get an email in the next two days asking about your experience. I hope that your e-visit has been valuable and will speed your recovery.

## 2022-05-30 NOTE — Progress Notes (Signed)
I have spent 5 minutes in review of e-visit questionnaire, review and updating patient chart, medical decision making and response to patient.   Eaven Schwager Cody Tanicka Bisaillon, PA-C    

## 2022-06-05 ENCOUNTER — Encounter: Payer: Self-pay | Admitting: Family Medicine

## 2022-06-06 ENCOUNTER — Encounter: Payer: Self-pay | Admitting: Family Medicine

## 2022-06-06 ENCOUNTER — Ambulatory Visit: Payer: Managed Care, Other (non HMO) | Admitting: Family Medicine

## 2022-06-06 VITALS — BP 138/70 | HR 105 | Temp 97.1°F | Ht 67.0 in | Wt 226.8 lb

## 2022-06-06 DIAGNOSIS — B86 Scabies: Secondary | ICD-10-CM | POA: Diagnosis not present

## 2022-06-06 DIAGNOSIS — T7840XA Allergy, unspecified, initial encounter: Secondary | ICD-10-CM | POA: Diagnosis not present

## 2022-06-06 MED ORDER — METHYLPREDNISOLONE 4 MG PO TBPK: ORAL_TABLET | ORAL | 0 refills | Status: DC

## 2022-06-06 NOTE — Progress Notes (Signed)
[facility] [address] [address] Dept: [phone] Dept Fax: [phone]  Office Visit  Subjective:    Patient ID: Cindy Boyer, female    DOB: [birth year], 57 y.o..   MRN: [id number]  Chief Complaint  Patient presents with   Acute Visit    C/o having a rash on arms, back, chest x 2 weeks.      History of Present Illness:  Patient is in today for assessment of a rash. She notes this developed about a week ago. The rash has been pruritic. It involves the trunk and upper extremities. She was seen for a E-visit and was felt to have scabies. She was treated with permethrin 5% cream. She notes she used the cream as instructed. She feels the initial rash is improving, however, she has had some ongoing itchiness. She is also now noting a new rash around her neck line and towards her right axilla. This is quite itchy as well.  Past Medical History: Patient Active Problem List   Diagnosis Date Noted   Viral syndrome 06/22/2021   Unsatisfactory cervical Papanicolaou smear 06/06/2021   Healthcare maintenance 06/06/2021   Essential hypertension 03/09/2021   Primary osteoarthritis of both knees 03/09/2021   Acute pain of left knee 05/12/2019   Past Surgical History:  Procedure Laterality Date   ABDOMINAL HYSTERECTOMY  2012   BREAST BIOPSY  2018   CERVICAL CONIZATION W/BX  05/14/2011   Procedure: CONIZATION CERVIX WITH BIOPSY;  Surgeon: Catha Brow;  Location: [facility];  Service: Gynecology;  Laterality: N/A;   COLPOSCOPY     REPLACEMENT TOTAL KNEE Left 2022   SVD     X 1   TOTAL KNEE ARTHROPLASTY Right 2023   WISDOM TOOTH EXTRACTION     Family History  Problem Relation Age of Onset   Breast cancer Mother    Arthritis Mother    Depression Father    Alcohol abuse Father    Depression Brother    Arthritis Maternal Grandmother    Arthritis Brother    Outpatient Medications Prior to Visit  Medication Sig  Dispense Refill   albuterol (VENTOLIN HFA) 108 (90 Base) MCG/ACT inhaler Inhale 2 puffs into the lungs every 6 (six) hours as needed for wheezing or shortness of breath. 8 g 0   calcium carbonate (TUMS - DOSED IN MG ELEMENTAL CALCIUM) 500 MG chewable tablet Chew 1 tablet by mouth daily.     cetirizine (ZYRTEC) 10 MG tablet Take 10 mg by mouth daily.     FEROSUL 325 (65 Fe) MG tablet Take 1 tablet (325 mg total) by mouth daily with breakfast. 30 tablet 3   fluticasone (FLONASE) 50 MCG/ACT nasal spray Place 2 sprays into both nostrils daily. 16 g 0   hydrOXYzine (VISTARIL) 25 MG capsule Take 1 capsule (25 mg total) by mouth every 8 (eight) hours as needed. 30 capsule 0   ibuprofen (ADVIL) 200 MG tablet Take 200 mg by mouth every 6 (six) hours as needed for moderate pain.     lisinopril (ZESTRIL) 40 MG tablet TAKE ONE TABLET BY MOUTH DAILY 90 tablet 3   Multiple Vitamin (MULTIVITAMIN) capsule Take 1 capsule by mouth daily.     Vitamin D, Ergocalciferol, (DRISDOL) 1.25 MG (50000 UNIT) CAPS capsule TAKE ONE CAPSULE BY MOUTH ONCE WEEKLY 12 capsule 1   benzonatate (TESSALON) 100 MG capsule Take 1 capsule (100 mg total) by mouth 2 (two) times daily as needed for cough.  20 capsule 0   No facility-administered medications prior to visit.   Allergies  Allergen Reactions   Penicillins Itching, Swelling and Rash    Did it involve swelling of the face/tongue/throat, SOB, or low BP? No Did it involve sudden or severe rash/hives, skin peeling, or any reaction on the inside of your mouth or nose? Yes Did you need to seek medical attention at a hospital or doctor's office? Yes When did it last happen?  57 years old      If all above answers are "NO", may proceed with cephalosporin use.   Permethrin Hives     Objective:   Today's Vitals   06/06/22 0828  BP: 138/70  Pulse: (!) 105  Temp: (!) 97.1 F (36.2 C)  TempSrc: Temporal  SpO2: 97%  Weight: 226 lb 12.8 oz (102.9 kg)  Height: '5\' 7"'$  (1.702 m)    Body mass index is 35.52 kg/m.   General: Well developed, well nourished. No acute distress. Skin: Warm and dry. There are diffuse areas of excoriated scattered lesion son the upper extremities and   the back. There are associated excoriations. There is sparing of the hands. Additionally, there are   confluent plaques around the neck and into the right axilla consistent with hives. Psych: Alert and oriented. Normal mood and affect.  Health Maintenance Due  Topic Date Due   COLONOSCOPY (Pts 45-70yr Insurance coverage will need to be confirmed)  Never done   Lung Cancer Screening  12/18/2015   MAMMOGRAM  02/06/2019     Assessment & Plan:   1. Scabies I do agree with the original assessment that this was a scabies outbreak. We discussed that it could take 1-2 weeks after permethrin use to resolve the rash and have the itching resolve.  2. Allergic reaction to drug, initial encounter I believe Ms. FGullicksonis having an allergic reaction to the permethrin causing localized hives.  I will prescribe a course of steroids to try and counteract this. She can also use Benadryl as needed.  - methylPREDNISolone (MEDROL DOSEPAK) 4 MG TBPK tablet; Take per package instructions  Dispense: 21 tablet; Refill: 0   Return if symptoms worsen or fail to improve.   SHaydee Salter MD

## 2022-06-12 ENCOUNTER — Encounter: Payer: Self-pay | Admitting: Family Medicine

## 2022-06-15 ENCOUNTER — Encounter: Payer: Self-pay | Admitting: Family Medicine

## 2022-06-15 ENCOUNTER — Ambulatory Visit: Payer: Managed Care, Other (non HMO) | Admitting: Family Medicine

## 2022-06-15 VITALS — BP 138/82 | HR 86 | Temp 98.4°F | Resp 16 | Ht 67.0 in | Wt 227.5 lb

## 2022-06-15 DIAGNOSIS — I10 Essential (primary) hypertension: Secondary | ICD-10-CM

## 2022-06-15 DIAGNOSIS — E538 Deficiency of other specified B group vitamins: Secondary | ICD-10-CM | POA: Diagnosis not present

## 2022-06-15 DIAGNOSIS — E559 Vitamin D deficiency, unspecified: Secondary | ICD-10-CM

## 2022-06-15 DIAGNOSIS — D649 Anemia, unspecified: Secondary | ICD-10-CM

## 2022-06-15 LAB — BASIC METABOLIC PANEL
BUN: 21 mg/dL (ref 6–23)
CO2: 25 mEq/L (ref 19–32)
Calcium: 9.3 mg/dL (ref 8.4–10.5)
Chloride: 102 mEq/L (ref 96–112)
Creatinine, Ser: 0.8 mg/dL (ref 0.40–1.20)
GFR: 82.33 mL/min (ref >60.00)
Glucose, Bld: 102 mg/dL — ABNORMAL HIGH (ref 70–99)
Potassium: 4.3 mEq/L (ref 3.5–5.1)
Sodium: 139 mEq/L (ref 135–145)

## 2022-06-15 LAB — B12 AND FOLATE PANEL
Folate: 11.4 ng/mL (ref >5.9)
Vitamin B-12: 210 pg/mL — ABNORMAL LOW (ref 211–911)

## 2022-06-15 NOTE — Progress Notes (Signed)
Established Patient Office Visit   Subjective:  Patient ID: Cindy Boyer, female    DOB: 07-23-1965  Age: 57 y.o. MRN: [id number]  Chief Complaint  Patient presents with   Medical Management of Chronic Issues    Following up on blood pressure.   Allergic Reaction    To medication    Allergic Reaction Pertinent negatives include no abdominal pain, eye redness or rash.   Encounter Diagnoses  Name Primary?   Normocytic anemia Yes   Essential hypertension    Vitamin D deficiency    Follow-up of above.  Tolerating the lisinopril 40 mg well.  Blood pressures are running in the 130s over 70 to 80s.  She is on high-dose weekly vitamin D.  She is taking iron sulfate daily.  Doing well status post knee replacement.  Has been exercising losing weight.   Review of Systems  Constitutional: Negative.   HENT: Negative.    Eyes:  Negative for blurred vision, discharge and redness.  Respiratory: Negative.    Cardiovascular: Negative.   Gastrointestinal:  Negative for abdominal pain.  Genitourinary: Negative.   Musculoskeletal: Negative.  Negative for myalgias.  Skin:  Negative for rash.  Neurological:  Negative for tingling, loss of consciousness and weakness.  Endo/Heme/Allergies:  Negative for polydipsia.     Current Outpatient Medications:    albuterol (VENTOLIN HFA) 108 (90 Base) MCG/ACT inhaler, Inhale 2 puffs into the lungs every 6 (six) hours as needed for wheezing or shortness of breath., Disp: 8 g, Rfl: 0   calcium carbonate (TUMS - DOSED IN MG ELEMENTAL CALCIUM) 500 MG chewable tablet, Chew 1 tablet by mouth daily., Disp: , Rfl:    cetirizine (ZYRTEC) 10 MG tablet, Take 10 mg by mouth daily., Disp: , Rfl:    FEROSUL 325 (65 Fe) MG tablet, Take 1 tablet (325 mg total) by mouth daily with breakfast., Disp: 30 tablet, Rfl: 3   fluticasone (FLONASE) 50 MCG/ACT nasal spray, Place 2 sprays into both nostrils daily., Disp: 16 g, Rfl: 0   hydrOXYzine (VISTARIL) 25 MG capsule, Take  1 capsule (25 mg total) by mouth every 8 (eight) hours as needed., Disp: 30 capsule, Rfl: 0   ibuprofen (ADVIL) 200 MG tablet, Take 200 mg by mouth every 6 (six) hours as needed for moderate pain., Disp: , Rfl:    lisinopril (ZESTRIL) 40 MG tablet, TAKE ONE TABLET BY MOUTH DAILY, Disp: 90 tablet, Rfl: 3   Multiple Vitamin (MULTIVITAMIN) capsule, Take 1 capsule by mouth daily., Disp: , Rfl:    Vitamin D, Ergocalciferol, (DRISDOL) 1.25 MG (50000 UNIT) CAPS capsule, TAKE ONE CAPSULE BY MOUTH ONCE WEEKLY, Disp: 12 capsule, Rfl: 1   methylPREDNISolone (MEDROL DOSEPAK) 4 MG TBPK tablet, Take per package instructions (Patient not taking: Reported on 06/15/2022), Disp: 21 tablet, Rfl: 0   Objective:     BP 138/82   Pulse 86   Temp 98.4 F (36.9 C)   Resp 16   Ht '5\' 7"'$  (1.702 m)   Wt 227 lb 8 oz (103.2 kg)   LMP 03/21/2011   SpO2 98%   BMI 35.63 kg/m  BP Readings from Last 3 Encounters:  06/15/22 138/82  06/06/22 138/70  05/11/22 (!) 162/80   Wt Readings from Last 3 Encounters:  06/15/22 227 lb 8 oz (103.2 kg)  06/06/22 226 lb 12.8 oz (102.9 kg)  05/11/22 234 lb 6.4 oz (106.3 kg)      Physical Exam Constitutional:      General: She is  not in acute distress.    Appearance: Normal appearance. She is not ill-appearing, toxic-appearing or diaphoretic.  HENT:     Head: Normocephalic and atraumatic.     Right Ear: External ear normal.     Left Ear: External ear normal.     Mouth/Throat:     Mouth: Mucous membranes are moist.     Pharynx: Oropharynx is clear. No oropharyngeal exudate or posterior oropharyngeal erythema.  Eyes:     General: No scleral icterus.       Right eye: No discharge.        Left eye: No discharge.     Extraocular Movements: Extraocular movements intact.     Conjunctiva/sclera: Conjunctivae normal.     Pupils: Pupils are equal, round, and reactive to light.  Cardiovascular:     Rate and Rhythm: Normal rate and regular rhythm.  Pulmonary:     Effort:  Pulmonary effort is normal. No respiratory distress.     Breath sounds: Normal breath sounds.  Musculoskeletal:     Cervical back: No rigidity or tenderness.  Skin:    General: Skin is warm and dry.  Neurological:     Mental Status: She is alert and oriented to person, place, and time.  Psychiatric:        Mood and Affect: Mood normal.        Behavior: Behavior normal.      No results found for any visits on 06/15/22.    The 10-year ASCVD risk score (Arnett DK, et al., 2019) is: 2.7%    Assessment & Plan:   Normocytic anemia -     Iron, TIBC and Ferritin Panel -     B12 and Folate Panel  Essential hypertension -     Basic metabolic panel  Vitamin D deficiency    Return in about 6 months (around 12/14/2022).  Continue above medicines.  Continue weight loss efforts.  Checking iron, B12 and folate levels regarding normocytic anemia.  Patient will schedule mammogram and follow-up with GYN provider.  Libby Maw, MD

## 2022-06-16 LAB — IRON,TIBC AND FERRITIN PANEL
%SAT: 48 % (calc) — ABNORMAL HIGH (ref 16–45)
Ferritin: 23 ng/mL (ref 16–232)
Iron: 165 ug/dL — ABNORMAL HIGH (ref 45–160)
TIBC: 347 mcg/dL (calc) (ref 250–450)

## 2022-06-18 MED ORDER — VITAMIN B-12 1000 MCG PO TABS: 1000.0000 ug | ORAL_TABLET | Freq: Every day | ORAL | 2 refills | Status: DC

## 2022-06-18 NOTE — Addendum Note (Signed)
Addended by: Jon Billings on: 06/18/2022 08:24 AM   Modules accepted: Orders

## 2022-06-27 ENCOUNTER — Telehealth: Payer: Managed Care, Other (non HMO) | Admitting: Physician Assistant

## 2022-06-27 DIAGNOSIS — L509 Urticaria, unspecified: Secondary | ICD-10-CM | POA: Diagnosis not present

## 2022-06-27 MED ORDER — PREDNISONE 10 MG PO TABS: ORAL_TABLET | ORAL | 0 refills | Status: DC

## 2022-06-27 NOTE — Progress Notes (Signed)
E Visit for Rash  We are sorry that you are not feeling well. Here is how we plan to help!  Based on what you shared with me it looks like you have Urticaria (hives).  Hives are itchy red or white bumps on the skin. This itchy rash is also known as urticaria, or as nettle rash. In some cases this itchy rash is triggered by a physical stimulus. If this is the case, the condition is called inducible urticaria or physical urticaria. Examples of physical factors which can trigger hives include pressure, friction, sweating, cold, heat, sunlight and water. Treatments include avoiding the trigger (where possible), and antihistamines. Urticaria can be called acute (short-lived episode) or chronic (persisting).   I am prescribing a two week course of steroids (37 tablets of 10 mg prednisone).  Days 1-4 take 4 tablets (40 mg) daily  Days 5-8 take 3 tablets (30 mg) daily, Days 9-11 take 2 tablets (20 mg) daily, Days 12-14 take 1 tablet (10 mg) daily.   You can also take Benadryl 25 mg - 50 mg every 4 hours for itching (drowsiness can occur). Some will choose to use the non-drowsy antihistamines like Loratadine (Claritin), Fexofenadine (Allegra), or Cetirizine (Zyrtec) for itching.   You can also add Famotidine (Pepcid) '20mg'$  once or twice daily. This is a medication for heartburn, but is a histamine-2 receptor blocker, so can help with the rash and itching.    HOME CARE:  Take cool showers and avoid direct sunlight. Apply cool compress or wet dressings. Take a bath in an oatmeal bath.  Sprinkle content of one Aveeno packet under running faucet with comfortably warm water.  Bathe for 15-20 minutes, 1-2 times daily.  Pat dry with a towel. Do not rub the rash. Use hydrocortisone cream. Take an antihistamine like Benadryl for widespread rashes that itch.  The adult dose of Benadryl is 25-50 mg by mouth 4 times daily. Caution:  This type of medication may cause sleepiness.  Do not drink alcohol, drive, or operate  dangerous machinery while taking antihistamines.  Do not take these medications if you have prostate enlargement.  Read package instructions thoroughly on all medications that you take.  GET HELP RIGHT AWAY IF:  Symptoms don't go away after treatment. Severe itching that persists. If you rash spreads or swells. If you rash begins to smell. If it blisters and opens or develops a yellow-brown crust. You develop a fever. You have a sore throat. You become short of breath.  MAKE SURE YOU:  Understand these instructions. Will watch your condition. Will get help right away if you are not doing well or get worse.  Thank you for choosing an e-visit.  Your e-visit answers were reviewed by a board certified advanced clinical practitioner to complete your personal care plan. Depending upon the condition, your plan could have included both over the counter or prescription medications.  Please review your pharmacy choice. Make sure the pharmacy is open so you can pick up prescription now. If there is a problem, you may contact your provider through [employer] and have the prescription routed to another pharmacy.  Your safety is important to [place]. If you have drug allergies check your prescription carefully.   For the next 24 hours you can use MyChart to ask questions about today's visit, request a non-urgent call back, or ask for a work or school excuse. You will get an email in the next two days asking about your experience. I hope that your e-visit  has been valuable and will speed your recovery.  I have spent 5 minutes in review of e-visit questionnaire, review and updating patient chart, medical decision making and response to patient.   Mar Daring, PA-C

## 2022-08-28 ENCOUNTER — Other Ambulatory Visit: Payer: Self-pay | Admitting: Family Medicine

## 2022-08-28 DIAGNOSIS — E611 Iron deficiency: Secondary | ICD-10-CM

## 2022-11-09 ENCOUNTER — Telehealth: Payer: Self-pay | Admitting: Family Medicine

## 2022-11-13 NOTE — Telephone Encounter (Signed)
error 

## 2022-12-14 ENCOUNTER — Ambulatory Visit: Payer: Managed Care, Other (non HMO) | Admitting: Family Medicine

## 2022-12-17 ENCOUNTER — Ambulatory Visit: Payer: Managed Care, Other (non HMO) | Admitting: Family Medicine

## 2022-12-17 ENCOUNTER — Telehealth: Payer: Self-pay | Admitting: Family Medicine

## 2022-12-17 NOTE — Telephone Encounter (Signed)
7.15.24 no show letter sent

## 2022-12-17 NOTE — Telephone Encounter (Signed)
2nd no show, fee generated, final warning letter sent, text sent

## 2022-12-22 ENCOUNTER — Emergency Department (HOSPITAL_COMMUNITY)
Admission: EM | Admit: 2022-12-22 | Discharge: 2022-12-23 | Disposition: A | Discharge status: Home / Self Care | Payer: Managed Care, Other (non HMO) | Attending: Emergency Medicine | Admitting: Emergency Medicine

## 2022-12-22 DIAGNOSIS — I10 Essential (primary) hypertension: Secondary | ICD-10-CM | POA: Diagnosis not present

## 2022-12-22 DIAGNOSIS — Z79899 Other long term (current) drug therapy: Secondary | ICD-10-CM | POA: Insufficient documentation

## 2022-12-22 DIAGNOSIS — M25552 Pain in left hip: Secondary | ICD-10-CM | POA: Diagnosis present

## 2022-12-22 DIAGNOSIS — M5442 Lumbago with sciatica, left side: Secondary | ICD-10-CM | POA: Diagnosis not present

## 2022-12-22 DIAGNOSIS — M5432 Sciatica, left side: Secondary | ICD-10-CM

## 2022-12-22 MED ORDER — ACETAMINOPHEN 325 MG PO TABS
650.0000 mg | ORAL_TABLET | Freq: Once | ORAL | Status: AC
  Administered 2022-12-22: 650 mg via ORAL
  Filled 2022-12-22: qty 2

## 2022-12-22 MED ORDER — METHYLPREDNISOLONE SODIUM SUCC 125 MG IJ SOLR
125.0000 mg | Freq: Once | INTRAMUSCULAR | Status: AC
  Administered 2022-12-22: 125 mg via INTRAVENOUS
  Filled 2022-12-22: qty 2

## 2022-12-22 MED ORDER — METHOCARBAMOL 1000 MG/10ML IJ SOLN
1000.0000 mg | Freq: Once | INTRAVENOUS | Status: AC
  Administered 2022-12-22: 1000 mg via INTRAVENOUS
  Filled 2022-12-22: qty 1000

## 2022-12-22 MED ORDER — KETOROLAC TROMETHAMINE 30 MG/ML IJ SOLN
30.0000 mg | Freq: Once | INTRAMUSCULAR | Status: AC
  Administered 2022-12-22: 30 mg via INTRAVENOUS
  Filled 2022-12-22: qty 1

## 2022-12-22 NOTE — ED Provider Notes (Signed)
[place] EMERGENCY DEPARTMENT AT [facility] Provider Note   CSN: [id number] Arrival date & time: 12/22/22  2229     History {Add pertinent medical, surgical, social history, OB history to HPI:1} Chief Complaint  Patient presents with   Hip Pain    Pt c/o hip pain, back , leg pain , denies injury or fall, reports more active on vacation.     Cindy Boyer is a 57 y.o. female.  The history is provided by the patient.  Hip Pain  She has a history of hypertension and comes in complaining of pain in the left sacral area radiating down her posterior thigh to the level of the knee.  This started this morning and has gotten worse.  She tried taking ibuprofen and Goody powder without relief.  She denies any numbness or tingling.  Pain is worse when she tries to bear weight and she has not been able to walk.  She denies any bowel or bladder dysfunction.  She has had back pain in the past but never anything this severe.  She had been doing some unusual activity having to spent a week at the beach with great-grandchildren.   Home Medications Prior to Admission medications   Medication Sig Start Date End Date Taking? Authorizing Provider  albuterol (VENTOLIN HFA) 108 (90 Base) MCG/ACT inhaler Inhale 2 puffs into the lungs every 6 (six) hours as needed for wheezing or shortness of breath. 05/04/22   Freddy Finner, NP  calcium carbonate (TUMS - DOSED IN MG ELEMENTAL CALCIUM) 500 MG chewable tablet Chew 1 tablet by mouth daily.    [provider]  cetirizine (ZYRTEC) 10 MG tablet Take 10 mg by mouth daily.    [provider]  cyanocobalamin (VITAMIN B12) 1000 MCG tablet Take 1 tablet (1,000 mcg total) by mouth daily. 06/18/22   Mliss Sax, MD  FEROSUL 325 (65 Fe) MG tablet TAKE 1 TABLET BY MOUTH DAILY WITH BREAKFAST 08/29/22   Mliss Sax, MD  fluticasone [facility]) 50 MCG/ACT nasal spray Place 2 sprays into both nostrils daily. 05/04/22   Freddy Finner, NP  hydrOXYzine (VISTARIL) 25 MG capsule Take 1 capsule (25 mg total) by mouth every 8 (eight) hours as needed. 05/30/22   Waldon Merl, PA-C  ibuprofen (ADVIL) 200 MG tablet Take 200 mg by mouth every 6 (six) hours as needed for moderate pain.    [provider]  lisinopril (ZESTRIL) 40 MG tablet TAKE ONE TABLET BY MOUTH DAILY 03/05/22   Mliss Sax, MD  Multiple Vitamin (MULTIVITAMIN) capsule Take 1 capsule by mouth daily.    [provider]  predniSONE (DELTASONE) 10 MG tablet Days 1-4 take 4 tablets (40 mg) daily  Days 5-8 take 3 tablets (30 mg) daily, Days 9-11 take 2 tablets (20 mg) daily, Days 12-14 take 1 tablet (10 mg) daily. 06/27/22   Margaretann Loveless, PA-C  Vitamin D, Ergocalciferol, (DRISDOL) 1.25 MG (50000 UNIT) CAPS capsule TAKE ONE CAPSULE BY MOUTH ONCE WEEKLY 02/23/22   Mliss Sax, MD      Allergies    Penicillins and Permethrin    Review of Systems   Review of Systems  All other systems reviewed and are negative.   Physical Exam Updated Vital Signs BP (!) 139/59   Pulse 94   Temp 98.4 F (36.9 C) (Oral)   Resp 19   LMP 03/21/2011   SpO2 98%  Physical Exam Vitals and nursing note reviewed.  57 year old female, resting comfortably and in no acute distress. Vital signs are normal. Oxygen saturation is 98%, which is normal. Head is normocephalic and atraumatic. PERRLA, EOMI. Oropharynx is clear. Neck is nontender and supple without adenopathy or JVD. Back is moderately tender over the mid lumbar spine.  There is moderate left paralumbar spasm.  Straight leg raise is positive on the left at 15 degrees, crossed straight leg raise is positive on the right at 30 degrees. Lungs are clear without rales, wheezes, or rhonchi. Chest is nontender. Heart has regular rate and rhythm without murmur. Abdomen is soft, flat, nontender without masses or hepatosplenomegaly and peristalsis is normoactive. Extremities have trace  edema, full range of motion is present. Skin is warm and dry without rash. Neurologic: Mental status is normal, cranial nerves are intact, motor strength is 5/5 in all 4 extremities, including dorsiflexion and plantarflexion and great toe extension.  Normal pinprick sensation throughout.  ED Results / Procedures / Treatments   Labs (all labs ordered are listed, but only abnormal results are displayed) Labs Reviewed - No data to display  EKG None  Radiology No results found.  Procedures Procedures  {Document cardiac monitor, telemetry assessment procedure when appropriate:1}  Medications Ordered in ED Medications - No data to display  ED Course/ Medical Decision Making/ A&P   {   Click here for ABCD2, HEART and other calculatorsREFRESH Note before signing :1}                          Medical Decision Making  Left-sided sciatica without evidence of neurologic compromise.  No indication for imaging.  I have ordered intravenous ketorolac, methocarbamol, methylprednisolone and oral acetaminophen.  {Document critical care time when appropriate:1} {Document review of labs and clinical decision tools ie heart score, Chads2Vasc2 etc:1}  {Document your independent review of radiology images, and any outside records:1} {Document your discussion with family members, caretakers, and with consultants:1} {Document social determinants of health affecting pt's care:1} {Document your decision making why or why not admission, treatments were needed:1} Final Clinical Impression(s) / ED Diagnoses Final diagnoses:  None    Rx / DC Orders ED Discharge Orders     None

## 2022-12-23 MED ORDER — PREDNISONE 50 MG PO TABS: 50.0000 mg | ORAL_TABLET | Freq: Every day | ORAL | 0 refills | Status: DC

## 2022-12-23 MED ORDER — ORPHENADRINE CITRATE ER 100 MG PO TB12: 100.0000 mg | ORAL_TABLET | Freq: Two times a day (BID) | ORAL | 0 refills | Status: DC

## 2022-12-23 MED ORDER — MORPHINE SULFATE (PF) 4 MG/ML IV SOLN
4.0000 mg | Freq: Once | INTRAVENOUS | Status: AC
  Administered 2022-12-23: 4 mg via INTRAVENOUS

## 2022-12-23 MED ORDER — NAPROXEN 500 MG PO TABS: 500.0000 mg | ORAL_TABLET | Freq: Two times a day (BID) | ORAL | 0 refills | Status: DC

## 2022-12-23 MED ORDER — MORPHINE SULFATE (PF) 4 MG/ML IV SOLN
INTRAVENOUS | Status: AC
  Filled 2022-12-23: qty 1

## 2022-12-23 MED ORDER — OXYCODONE HCL 5 MG PO TABS: 5.0000 mg | ORAL_TABLET | ORAL | 0 refills | Status: DC | PRN

## 2022-12-23 NOTE — ED Notes (Signed)
Pt did not tolerating getting out of bed to ambulate, MD made aware, reports will place orders shortly.

## 2022-12-23 NOTE — ED Notes (Signed)
Pt tolerated ambulating  

## 2022-12-23 NOTE — Discharge Instructions (Signed)
Take naproxen and orphenadrine twice a day.  To get additional pain relief, add acetaminophen.  If you need additional pain relief beyond that, you may add oxycodone.  Please contact your primary care provider for follow-up.  He also can order physical therapy, which may be helpful.  Return if pain is not being adequately controlled at home, you develop new weakness or loss of bladder or bowel control.

## 2023-01-29 ENCOUNTER — Other Ambulatory Visit: Payer: Self-pay | Admitting: Family Medicine

## 2023-01-29 DIAGNOSIS — I10 Essential (primary) hypertension: Secondary | ICD-10-CM

## 2023-04-03 ENCOUNTER — Telehealth: Payer: Managed Care, Other (non HMO) | Admitting: Physician Assistant

## 2023-04-03 DIAGNOSIS — M5432 Sciatica, left side: Secondary | ICD-10-CM

## 2023-04-03 MED ORDER — PREDNISONE 10 MG (21) PO TBPK: ORAL_TABLET | ORAL | 0 refills | Status: DC

## 2023-04-03 MED ORDER — BACLOFEN 10 MG PO TABS: 10.0000 mg | ORAL_TABLET | Freq: Three times a day (TID) | ORAL | 0 refills | Status: DC

## 2023-04-03 NOTE — Progress Notes (Signed)
I have spent 5 minutes in review of e-visit questionnaire, review and updating patient chart, medical decision making and response to patient.   Mia Milan Cody Jacklynn Dehaas, PA-C    

## 2023-04-03 NOTE — Progress Notes (Signed)

## 2023-04-30 ENCOUNTER — Telehealth: Payer: Managed Care, Other (non HMO) | Admitting: Physician Assistant

## 2023-04-30 DIAGNOSIS — M79676 Pain in unspecified toe(s): Secondary | ICD-10-CM

## 2023-04-30 NOTE — Progress Notes (Signed)
Because you have no prior diagnosis of gout and because we need to differentiate gout from other causes of joint swelling and pain -- infection, pseudogout, etc -- I feel your condition warrants further evaluation and I recommend that you be seen in a face to face visit.   NOTE: There will be NO CHARGE for this eVisit   If you are having a true medical emergency please call 911.      For an urgent face to face visit, [place] has eight urgent care centers for your convenience:   NEW!! [facility] Health Urgent Care Center at [employer] [facility] Get Driving Directions [phone] [address] [place], [address]    [facility] Health Urgent Care Center at [facility] Get Driving Directions [phone] [address] [place], [place] [address]   [facility] Center [facility]) Get Driving Directions [phone] [address] [place], [place] [address]  [facility]gent Care Center [facility]) Get Driving Directions [phone] [address] [place],  [place]  [address]  [facility] Health Urgent Care Center [facility] - at [employer]  [phone] [address] [place],  [place] [address]   [facility] Health Urgent Care at [facility] Get Driving Directions [phone] [address] [place], [place] [address]   [facility] Health Urgent Care at [facility] Get Driving Directions  [phone] [address] [place], [place] [address]   [facility] Health Urgent Care at [facility] Directions [phone] [address] [place], [place] [address]  Your MyChart E-visit questionnaire answers were reviewed by a board certified advanced clinical practitioner to complete your personal care plan based on your specific symptoms.  Thank you for using e-Visits.

## 2023-05-10 ENCOUNTER — Telehealth: Payer: Managed Care, Other (non HMO) | Admitting: Family Medicine

## 2023-05-10 DIAGNOSIS — M5416 Radiculopathy, lumbar region: Secondary | ICD-10-CM

## 2023-05-10 DIAGNOSIS — M5432 Sciatica, left side: Secondary | ICD-10-CM

## 2023-05-10 MED ORDER — BACLOFEN 10 MG PO TABS: 10.0000 mg | ORAL_TABLET | Freq: Three times a day (TID) | ORAL | 0 refills | Status: DC

## 2023-05-10 MED ORDER — NAPROXEN 500 MG PO TABS: 500.0000 mg | ORAL_TABLET | Freq: Two times a day (BID) | ORAL | 0 refills | Status: AC

## 2023-05-10 NOTE — Progress Notes (Signed)
E-Visit for Back Pain   We are sorry that you are not feeling well.  Here is how we plan to help!  Based on what you have shared with me it looks like you mostly have acute back pain.  Acute back pain is defined as musculoskeletal pain that can resolve in 1-3 weeks with conservative treatment.  I have prescribed Naprosyn 500 mg take one by mouth twice a day non-steroid anti-inflammatory (NSAID) as well as Baclofen 10 mg every eight hours as needed which is a muscle relaxer  Some patients experience stomach irritation or in increased heartburn with anti-inflammatory drugs.  Please keep in mind that muscle relaxer's can cause fatigue and should not be taken while at work or driving.  Back pain is very common.  The pain often gets better over time.  The cause of back pain is usually not dangerous.  Most people can learn to manage their back pain on their own.  Home Care Stay active.  Start with short walks on flat ground if you can.  Try to walk farther each day. Do not sit, drive or stand in one place for more than 30 minutes.  Do not stay in bed. Do not avoid exercise or work.  Activity can help your back heal faster. Be careful when you bend or lift an object.  Bend at your knees, keep the object close to you, and do not twist. Sleep on a firm mattress.  Lie on your side, and bend your knees.  If you lie on your back, put a pillow under your knees. Only take medicines as told by your doctor. Put ice on the injured area. Put ice in a plastic bag Place a towel between your skin and the bag Leave the ice on for 15-20 minutes, 3-4 times a day for the first 2-3 days. 210 After that, you can switch between ice and heat packs. Ask your doctor about back exercises or massage. Avoid feeling anxious or stressed.  Find good ways to deal with stress, such as exercise.  Get Help Right Way If: Your pain does not go away with rest or medicine. Your pain does not go away in 1 week. You have new  problems. You do not feel well. The pain spreads into your legs. You cannot control when you poop (bowel movement) or pee (urinate) You feel sick to your stomach (nauseous) or throw up (vomit) You have belly (abdominal) pain. You feel like you may pass out (faint). If you develop a fever.  Make Sure you: Understand these instructions. Will watch your condition Will get help right away if you are not doing well or get worse.  Your e-visit answers were reviewed by a board certified advanced clinical practitioner to complete your personal care plan.  Depending on the condition, your plan could have included both over the counter or prescription medications.  If there is a problem please reply  once you have received a response from your provider.  Your safety is important to [place].  If you have drug allergies check your prescription carefully.    You can use MyChart to ask questions about today's visit, request a non-urgent call back, or ask for a work or school excuse for 24 hours related to this e-Visit. If it has been greater than 24 hours you will need to follow up with your provider, or enter a new e-Visit to address those concerns.  You will get an e-mail in the next two days asking about  your experience.  I hope that your e-visit has been valuable and will speed your recovery. Thank you for using e-visits.   have provided 5 minutes of non face to face time during this encounter for chart review and documentation.

## 2023-05-20 ENCOUNTER — Encounter: Payer: Self-pay | Admitting: Internal Medicine

## 2023-05-20 ENCOUNTER — Ambulatory Visit: Payer: Managed Care, Other (non HMO) | Admitting: Internal Medicine

## 2023-05-20 VITALS — BP 138/84 | HR 91 | Temp 98.1°F | Ht 67.0 in | Wt 237.4 lb

## 2023-05-20 DIAGNOSIS — M5432 Sciatica, left side: Secondary | ICD-10-CM | POA: Diagnosis not present

## 2023-05-20 MED ORDER — PREDNISONE 10 MG (21) PO TBPK: ORAL_TABLET | ORAL | 0 refills | Status: DC

## 2023-05-20 MED ORDER — KETOROLAC TROMETHAMINE 60 MG/2ML IM SOLN
60.0000 mg | Freq: Once | INTRAMUSCULAR | Status: AC
Start: 2023-05-20 — End: 2023-05-20
  Administered 2023-05-20: 60 mg via INTRAMUSCULAR

## 2023-05-20 MED ORDER — VITAMIN B-12 1000 MCG PO TABS: 1000.0000 ug | ORAL_TABLET | Freq: Every day | ORAL | Status: AC

## 2023-05-20 MED ORDER — TRAMADOL HCL 50 MG PO TABS
50.0000 mg | ORAL_TABLET | Freq: Four times a day (QID) | ORAL | 0 refills | Status: AC | PRN
Start: 2023-05-20 — End: 2023-05-25

## 2023-05-20 NOTE — Progress Notes (Signed)
[facility] [address] [place] [address] Dept: [phone] Dept Fax: [phone]  Acute Care Office Visit  Subjective:   Cindy Boyer 03-16-1966 05/20/2023  Chief Complaint  Patient presents with   Hip Pain    Fall mid - October Started 2 weeks ago  Taking naproxen 500 mg     HPI: Discussed the use of AI scribe software for clinical note transcription with the patient, who gave verbal consent to proceed.  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 The pain began in July after a vacation, which was the first significant activity since their knee replacements. The pain was so severe that they required an ambulance and described it as worse than childbirth. The pain improved with medication.  In October, the patient fell at work and experienced a resurgence of the sciatica pain a few days later.  Was given muscle relaxer, steroids, and NSAIDS and pain resolved. Now, the pain has re-surged and progressively worsened over the past week and a half, to the point where the patient had to leave work due to the intensity of the pain. The patient describes the pain as localized to the lower back and radiating down the left leg, and it intensifies when walking or standing. She did an e-visit on 12/6 , dx with sciatica and given naproxen 500mg  BID and baclofen 10mg  PRN. No relief despite medication.       The following portions of the patient's history were reviewed and updated as appropriate: past medical history, past surgical history, family history, social history, allergies, medications, and problem list.   Patient Active Problem List   Diagnosis Date Noted   Viral syndrome 06/22/2021   Unsatisfactory cervical Papanicolaou smear 06/06/2021   Healthcare maintenance 06/06/2021   Essential hypertension 03/09/2021   Primary  osteoarthritis of both knees 03/09/2021   Acute pain of left knee 05/12/2019   Past Medical History:  Diagnosis Date   Bronchitis    HISTORY AS CHILD, NO PROBLEMS AS ADULT    Cancer (HCC)    Cervical   Environmental allergies    Past Surgical History:  Procedure Laterality Date   ABDOMINAL HYSTERECTOMY  2012   BREAST BIOPSY  2018   CERVICAL CONIZATION W/BX  05/14/2011   Procedure: CONIZATION CERVIX WITH BIOPSY;  Surgeon: Jessee Avers;  Location: [facility];  Service: Gynecology;  Laterality: N/A;   COLPOSCOPY     REPLACEMENT TOTAL KNEE Left 2022   SVD     X 1   TOTAL KNEE ARTHROPLASTY Right 2023   WISDOM TOOTH EXTRACTION     Family History  Problem Relation Age of Onset   Breast cancer Mother    Arthritis Mother    Depression Father    Alcohol abuse Father    Depression Brother    Arthritis Maternal Grandmother    Arthritis Brother     Current Outpatient Medications:    lisinopril (ZESTRIL) 40 MG tablet, TAKE 1 TABLET BY MOUTH DAILY, Disp: 90 tablet, Rfl: 3   naproxen (NAPROSYN) 500 MG tablet, Take 1 tablet (500 mg total) by mouth 2 (two) times daily., Disp: 60 tablet, Rfl: 0   predniSONE (STERAPRED UNI-PAK 21 TAB) 10 MG (21) TBPK tablet, Take as directed on packaging., Disp: 21 tablet, Rfl: 0   traMADol (ULTRAM) 50 MG tablet, Take 1 tablet (50 mg total) by mouth every 6 (six)  hours as needed for up to 5 days for severe pain (pain score 7-10)., Disp: 20 tablet, Rfl: 0   albuterol (VENTOLIN HFA) 108 (90 Base) MCG/ACT inhaler, Inhale 2 puffs into the lungs every 6 (six) hours as needed for wheezing or shortness of breath. (Patient not taking: Reported on 05/20/2023), Disp: 8 g, Rfl: 0   cyanocobalamin (VITAMIN B12) 1000 MCG tablet, Take 1 tablet (1,000 mcg total) by mouth daily., Disp: , Rfl:    FEROSUL 325 (65 Fe) MG tablet, TAKE 1 TABLET BY MOUTH DAILY WITH BREAKFAST (Patient not taking: Reported on 05/20/2023), Disp: 90 tablet, Rfl: 1   Multiple Vitamin (MULTIVITAMIN) capsule,  Take 1 capsule by mouth daily. (Patient not taking: Reported on 05/20/2023), Disp: , Rfl:    Vitamin D, Ergocalciferol, (DRISDOL) 1.25 MG (50000 UNIT) CAPS capsule, TAKE ONE CAPSULE BY MOUTH ONCE WEEKLY (Patient not taking: Reported on 05/20/2023), Disp: 12 capsule, Rfl: 1  Current Facility-Administered Medications:    ketorolac (TORADOL) injection 60 mg, 60 mg, Intramuscular, Once,  Allergies  Allergen Reactions   Penicillins Itching, Swelling and Rash    Did it involve swelling of the face/tongue/throat, SOB, or low BP? No Did it involve sudden or severe rash/hives, skin peeling, or any reaction on the inside of your mouth or nose? Yes Did you need to seek medical attention at a hospital or doctor's office? Yes When did it last happen?  57 years old      If all above answers are "NO", may proceed with cephalosporin use.   Permethrin Hives     ROS: A complete ROS was performed with pertinent positives/negatives noted in the HPI. The remainder of the ROS are negative.    Objective:   Today's Vitals   05/20/23 1009  BP: 138/84  Pulse: 91  Temp: 98.1 F (36.7 C)  TempSrc: Temporal  SpO2: 98%  Weight: 237 lb 6.4 oz (107.7 kg)  Height: 5\' 7"  (1.702 m)    GENERAL: Well-appearing, in NAD. Well nourished.  SKIN: Pink, warm and dry. No rash, lesion, ulceration, or ecchymoses.  NECK: Trachea midline. Full ROM w/o pain or tenderness. No lymphadenopathy.  RESPIRATORY: Chest wall symmetrical. Respirations even and non-labored. Breath sounds clear to auscultation bilaterally.  CARDIAC: S1, S2 present, regular rate and rhythm. Peripheral pulses 2+ bilaterally.  MSK: Muscle tone and strength appropriate for age. TTP to left buttock. (+)reproducible pain to L. Lower back and buttock with left straight leg raise  EXTREMITIES: Without clubbing, cyanosis, or edema.  NEUROLOGIC: No sensory deficits. Unsteady gait secondary to pain - using cane for assistance PSYCH/MENTAL STATUS: Alert, oriented  x 3. Cooperative, appropriate mood and affect.    No results found for any visits on 05/20/23.    Assessment & Plan:  Assessment and Plan    Sciatica of left side -Administer Toradol 60mg  injection today  -Start a short course of steroids. -Continue Naproxen for its anti-inflammatory effect. -Prescribe Tramadol for breakthrough severe pain not relief with naproxen -Advise to use ice packs and perform stretches  -Recommend taking a couple of days off work to rest. -If no improvement in a week, consider referral to orthopedics and potential imaging studies.      Meds ordered this encounter  Medications   cyanocobalamin (VITAMIN B12) 1000 MCG tablet    Sig: Take 1 tablet (1,000 mcg total) by mouth daily.    Supervising Provider:   Garnette Gunner [[id number]]   predniSONE (STERAPRED UNI-PAK 21 TAB) 10 MG (21) TBPK tablet  Sig: Take as directed on packaging.    Dispense:  21 tablet    Refill:  0    Supervising Provider:   Garnette Gunner [[id number]]   traMADol (ULTRAM) 50 MG tablet    Sig: Take 1 tablet (50 mg total) by mouth every 6 (six) hours as needed for up to 5 days for severe pain (pain score 7-10).    Dispense:  20 tablet    Refill:  0    Supervising Provider:   Garnette Gunner [[id number]]   ketorolac (TORADOL) injection 60 mg   No orders of the defined types were placed in this encounter.  Lab Orders  No laboratory test(s) ordered today   No images are attached to the encounter or orders placed in the encounter.  Return if symptoms worsen or fail to improve.   Salvatore Decent, FNP

## 2023-05-20 NOTE — Patient Instructions (Addendum)
Continue ice packs  Do stretches  Continue naproxen 1-2 times a day as needed for pain

## 2023-05-27 ENCOUNTER — Encounter: Payer: Self-pay | Admitting: Internal Medicine

## 2023-05-27 DIAGNOSIS — M5432 Sciatica, left side: Secondary | ICD-10-CM

## 2023-06-06 ENCOUNTER — Ambulatory Visit: Payer: Managed Care, Other (non HMO) | Admitting: Physical Medicine and Rehabilitation

## 2023-06-06 ENCOUNTER — Encounter: Payer: Self-pay | Admitting: Physical Medicine and Rehabilitation

## 2023-06-06 ENCOUNTER — Other Ambulatory Visit (INDEPENDENT_AMBULATORY_CARE_PROVIDER_SITE_OTHER): Payer: Managed Care, Other (non HMO)

## 2023-06-06 VITALS — BP 161/82 | HR 68

## 2023-06-06 DIAGNOSIS — M4316 Spondylolisthesis, lumbar region: Secondary | ICD-10-CM

## 2023-06-06 DIAGNOSIS — M5442 Lumbago with sciatica, left side: Secondary | ICD-10-CM

## 2023-06-06 DIAGNOSIS — M533 Sacrococcygeal disorders, not elsewhere classified: Secondary | ICD-10-CM

## 2023-06-06 DIAGNOSIS — M5416 Radiculopathy, lumbar region: Secondary | ICD-10-CM

## 2023-06-06 DIAGNOSIS — G8929 Other chronic pain: Secondary | ICD-10-CM

## 2023-06-06 MED ORDER — MELOXICAM 15 MG PO TABS: 15.0000 mg | ORAL_TABLET | Freq: Every day | ORAL | 0 refills | Status: DC

## 2023-06-06 NOTE — Progress Notes (Addendum)
 Cindy Boyer - 58 y.o. female MRN [id number]  Date of birth: Feb 28, 1966  Office Visit Note: Visit Date: 06/06/2023 PCP: Berneta Elsie Sayre, MD Referred by: Berneta Elsie Sayre,*  Subjective: Chief Complaint  Patient presents with   Lower Back - Pain   Left Hip - Pain   HPI: Cindy Boyer is a 58 y.o. female who comes in today per the request of Rosina Senters, NP for evaluation of chronic, worsening and severe bilateral lower back pain radiating to left buttock and down lateral thigh to knee. Left lateral hip and buttock pain seems to be most severe issue. Pain started in July. She was seen in the emergency department shortly after onset of pain, her symptoms resolved with oral medications and time. Her pain worsened after she sustained mechanical fall at work in October. No apparent injuries noted from fall. Her pain becomes severe with standing and activity. States she has to take breaks frequently at work due to increased discomfort. She describes pain as sharp, sore and stabbing sensation, currently rates as 8 out of 10. Some relief of pain with home exercise regimen, rest and use of medications. No history of formal physical therapy. No prior imaging of lumbar spine. She is currently working full time at [employer] in [employer]. She is managed by Dr. Donnice Car with [facility] from orthopedic standpoint. History of bilateral knee replacements. Patient denies focal weakness, numbness and tingling. No recent trauma or falls.      Review of Systems  Musculoskeletal:  Positive for back pain.  Neurological:  Negative for tingling, sensory change, focal weakness and weakness.  All other systems reviewed and are negative.  Otherwise per HPI.  Assessment & Plan: Visit Diagnoses:    ICD-10-CM   1. Sacroiliac joint dysfunction of left side  M53.3 Ambulatory referral to Physical Medicine Rehab    2. Chronic bilateral low back pain with left-sided sciatica  M54.42     G89.29     3. Lumbar radiculopathy  M54.16 XR Lumbar Spine Complete    4. Spondylolisthesis, lumbar region  M43.16        Plan: Findings:  Chronic, worsening and severe bilateral lower back pain radiating to left buttock and down lateral thigh to knee. Patient continues to have severe pain despite good conservative therapies such as home exercise regimen, rest and use of medications. Patients clinical presentation and exam are complex, differentials include sacroiliac joint dysfunction vs lumbar radiculopathy. FABER testing positive on exam today. I obtained lumbar radiographs in the office today that show grade 1 anterolisthesis of L5 on S1, there is also sclerosis and widening of bilateral sacroiliac joint, left greater than right. Clinically, she is afebrile, no symptoms of claudication and no concerning  findings/fractures on recent lumbar radiographs. We discussed treatment plan in detail today, next step is to place order diagnostic and hopefully therapeutic left sacroiliac joint injection under fluoroscopic guidance. I also placed order for short course of formal physical therapy, I do feel she would benefit from manual treatments and core strengthening. We discussed medication managed and I prescribed short course of Meloxicam . We would like to try left sacroiliac joint injection diagnostically,should her pain persist would be quick to order lumbar MRI imaging. No red flag symptoms noted upon exam today.     Meds & Orders:  Meds ordered this encounter  Medications   meloxicam  (MOBIC ) 15 MG tablet    Sig: Take 1 tablet (15 mg total) by mouth daily.  Dispense:  30 tablet    Refill:  0    Orders Placed This Encounter  Procedures   XR Lumbar Spine Complete   Ambulatory referral to Physical Medicine Rehab    Follow-up: Return for Left sacroiliac joint injection.   Procedures: No procedures performed      Clinical History: No specialty comments available.   She reports that she  quit smoking about 5 years ago. Her smoking use included cigarettes. She started smoking about 31 years ago. She has a 26 pack-year smoking history. She has never used smokeless tobacco. No results for input(s): HGBA1C, LABURIC in the last 8760 hours.  Objective:  VS:  HT:    WT:   BMI:     BP:(!) 161/82  HR:68bpm  TEMP: ( )  RESP:  Physical Exam Vitals and nursing note reviewed.  HENT:     Head: Normocephalic and atraumatic.     Right Ear: External ear normal.     Left Ear: External ear normal.     Nose: Nose normal.     Mouth/Throat:     Mouth: Mucous membranes are moist.  Eyes:     Extraocular Movements: Extraocular movements intact.  Cardiovascular:     Rate and Rhythm: Normal rate.     Pulses: Normal pulses.  Pulmonary:     Effort: Pulmonary effort is normal.  Abdominal:     General: Abdomen is flat. There is no distension.  Musculoskeletal:        General: Tenderness present.     Cervical back: Normal range of motion.     Comments: Patient rises from seated position to standing without difficulty. Good lumbar range of motion. No pain noted with facet loading. 5/5 strength noted with bilateral hip flexion, knee flexion/extension, ankle dorsiflexion/plantarflexion and EHL. No clonus noted bilaterally. No pain upon palpation of greater trochanters. No pain with internal/external rotation of bilateral hips. Positive FABER, SI compression and Gaenslen's testing on the left. Sensation intact bilaterally. Negative slump test bilaterally. Ambulates without aid, gait steady.     Skin:    General: Skin is warm and dry.  Neurological:     General: No focal deficit present.     Mental Status: She is alert and oriented to person, place, and time.  Psychiatric:        Mood and Affect: Mood normal.        Behavior: Behavior normal.     Ortho Exam  Imaging: No results found.   Past Medical/Family/Surgical/Social History: Medications & Allergies reviewed per EMR, new  medications updated. Patient Active Problem List   Diagnosis Date Noted   Viral syndrome 06/22/2021   Unsatisfactory cervical Papanicolaou smear 06/06/2021   Healthcare maintenance 06/06/2021   Essential hypertension 03/09/2021   Primary osteoarthritis of both knees 03/09/2021   Acute pain of left knee 05/12/2019   Past Medical History:  Diagnosis Date   Bronchitis    HISTORY AS CHILD, NO PROBLEMS AS ADULT    Cancer (HCC)    Cervical   Environmental allergies    Family History  Problem Relation Age of Onset   Breast cancer Mother    Arthritis Mother    Depression Father    Alcohol abuse Father    Depression Brother    Arthritis Maternal Grandmother    Arthritis Brother    Past Surgical History:  Procedure Laterality Date   ABDOMINAL HYSTERECTOMY  2012   BREAST BIOPSY  2018   CERVICAL CONIZATION W/BX  05/14/2011   Procedure:  CONIZATION CERVIX WITH BIOPSY;  Surgeon: Rexene DOROTHA Hoit;  Location: [facility];  Service: Gynecology;  Laterality: N/A;   COLPOSCOPY     REPLACEMENT TOTAL KNEE Left 2022   SVD     X 1   TOTAL KNEE ARTHROPLASTY Right 2023   WISDOM TOOTH EXTRACTION     Social History   Occupational History   Not on file  Tobacco Use   Smoking status: Former    Current packs/day: 0.00    Average packs/day: 1 pack/day for 26.0 years (26.0 ttl pk-yrs)    Types: Cigarettes    Start date: 08/21/1991    Quit date: 08/20/2017    Years since quitting: 5.8   Smokeless tobacco: Never  Vaping Use   Vaping status: Never Used  Substance and Sexual Activity   Alcohol use: Yes    Alcohol/week: 1.0 standard drink of alcohol    Types: 1 Cans of beer per week    Comment: social   Drug use: No   Sexual activity: Yes    Birth control/protection: None    Comment: HUSBAND VASECTOMY

## 2023-06-06 NOTE — Progress Notes (Signed)
 Functional Pain Scale - descriptive words and definitions  Distracting (5)    Aware of pain/able to complete some ADL's but limited by pain/sleep is affected and active distractions are only slightly useful. Moderate range order  Average Pain 5  Pain in lower back and buttocks that occasionally goes into buttocks. Started in July when she over did it at [employer]. She had a fall at work. Hx of both knees replaced.

## 2023-06-19 ENCOUNTER — Telehealth: Payer: Managed Care, Other (non HMO) | Admitting: Physician Assistant

## 2023-06-19 DIAGNOSIS — B9689 Other specified bacterial agents as the cause of diseases classified elsewhere: Secondary | ICD-10-CM | POA: Diagnosis not present

## 2023-06-19 DIAGNOSIS — J208 Acute bronchitis due to other specified organisms: Secondary | ICD-10-CM

## 2023-06-19 MED ORDER — AZITHROMYCIN 250 MG PO TABS
ORAL_TABLET | ORAL | 0 refills | Status: AC
Start: 2023-06-19 — End: 2023-06-24

## 2023-06-19 NOTE — Progress Notes (Signed)
 E-Visit for Cough   We are sorry that you are not feeling well.  Here is how we plan to help!  Based on your presentation I believe you most likely have A cough due to bacteria.  When patients have a fever and a productive cough with a change in color or increased sputum production, we are concerned about bacterial bronchitis.  If left untreated it can progress to pneumonia.  If your symptoms do not improve with your treatment plan it is important that you contact your provider.   I have prescribed Azithromyin 250 mg: two tablets now and then one tablet daily for 4 additonal days    In addition you may use A non-prescription cough medication called Mucinex DM: take 2 tablets every 12 hours.  From your responses in the eVisit questionnaire you describe inflammation in the upper respiratory tract which is causing a significant cough.  This is commonly called Bronchitis and has four common causes:   Allergies Viral Infections Acid Reflux Bacterial Infection Allergies, viruses and acid reflux are treated by controlling symptoms or eliminating the cause. An example might be a cough caused by taking certain blood pressure medications. You stop the cough by changing the medication. Another example might be a cough caused by acid reflux. Controlling the reflux helps control the cough.    HOME CARE Only take medications as instructed by your medical team. Complete the entire course of an antibiotic. Drink plenty of fluids and get plenty of rest. Avoid close contacts especially the very young and the elderly Cover your mouth if you cough or cough into your sleeve. Always remember to wash your hands A steam or ultrasonic humidifier can help congestion.   GET HELP RIGHT AWAY IF: You develop worsening fever. You become short of breath You cough up blood. Your symptoms persist after you have completed your treatment plan MAKE SURE YOU  Understand these instructions. Will watch your condition. Will  get help right away if you are not doing well or get worse.    Thank you for choosing an e-visit.  Your e-visit answers were reviewed by a board certified advanced clinical practitioner to complete your personal care plan. Depending upon the condition, your plan could have included both over the counter or prescription medications.  Please review your pharmacy choice. Make sure the pharmacy is open so you can pick up prescription now. If there is a problem, you may contact your provider through [employer] and have the prescription routed to another pharmacy.  Your safety is important to [place]. If you have drug allergies check your prescription carefully.   For the next 24 hours you can use MyChart to ask questions about today's visit, request a non-urgent call back, or ask for a work or school excuse. You will get an email in the next two days asking about your experience. I hope that your e-visit has been valuable and will speed your recovery.   I have spent 5 minutes in review of e-visit questionnaire, review and updating patient chart, medical decision making and response to patient.   Margaretann Loveless, PA-C

## 2023-06-22 ENCOUNTER — Encounter: Payer: Self-pay | Admitting: Nurse Practitioner

## 2023-06-22 ENCOUNTER — Telehealth: Payer: Managed Care, Other (non HMO) | Admitting: Nurse Practitioner

## 2023-06-22 DIAGNOSIS — M5440 Lumbago with sciatica, unspecified side: Secondary | ICD-10-CM | POA: Diagnosis not present

## 2023-06-22 MED ORDER — BACLOFEN 10 MG PO TABS
10.0000 mg | ORAL_TABLET | Freq: Three times a day (TID) | ORAL | 0 refills | Status: DC
Start: 2023-06-22 — End: 2023-08-20

## 2023-06-22 NOTE — Progress Notes (Signed)
I have spent 5 minutes in review of e-visit questionnaire, review and updating patient chart, medical decision making and response to patient.  ° °Jerrell Mangel W Secilia Apps, NP ° °  °

## 2023-06-22 NOTE — Progress Notes (Signed)

## 2023-06-23 ENCOUNTER — Encounter (HOSPITAL_COMMUNITY): Payer: Self-pay

## 2023-06-23 ENCOUNTER — Emergency Department (HOSPITAL_COMMUNITY)
Admission: EM | Admit: 2023-06-23 | Discharge: 2023-06-23 | Disposition: A | Discharge status: Home / Self Care | Payer: Managed Care, Other (non HMO) | Attending: Emergency Medicine | Admitting: Emergency Medicine

## 2023-06-23 ENCOUNTER — Other Ambulatory Visit: Payer: Self-pay

## 2023-06-23 DIAGNOSIS — Z79899 Other long term (current) drug therapy: Secondary | ICD-10-CM | POA: Diagnosis not present

## 2023-06-23 DIAGNOSIS — I10 Essential (primary) hypertension: Secondary | ICD-10-CM | POA: Diagnosis not present

## 2023-06-23 DIAGNOSIS — M5441 Lumbago with sciatica, right side: Secondary | ICD-10-CM | POA: Diagnosis not present

## 2023-06-23 DIAGNOSIS — M545 Low back pain, unspecified: Secondary | ICD-10-CM | POA: Diagnosis present

## 2023-06-23 MED ORDER — LIDOCAINE 5 % EX PTCH
1.0000 | MEDICATED_PATCH | CUTANEOUS | Status: DC
  Administered 2023-06-23: 1 via TRANSDERMAL
  Filled 2023-06-23: qty 1

## 2023-06-23 MED ORDER — PREDNISONE 20 MG PO TABS: 40.0000 mg | ORAL_TABLET | Freq: Every day | ORAL | 0 refills | Status: DC

## 2023-06-23 MED ORDER — METHOCARBAMOL 500 MG PO TABS
1000.0000 mg | ORAL_TABLET | Freq: Once | ORAL | Status: AC
  Administered 2023-06-23: 1000 mg via ORAL
  Filled 2023-06-23: qty 2

## 2023-06-23 MED ORDER — HYDROCODONE-ACETAMINOPHEN 5-325 MG PO TABS
1.0000 | ORAL_TABLET | Freq: Once | ORAL | Status: AC
  Administered 2023-06-23: 1 via ORAL
  Filled 2023-06-23: qty 1

## 2023-06-23 MED ORDER — HYDROCODONE-ACETAMINOPHEN 5-325 MG PO TABS: 1.0000 | ORAL_TABLET | Freq: Four times a day (QID) | ORAL | 0 refills | Status: DC | PRN

## 2023-06-23 MED ORDER — LIDOCAINE 5 % EX PTCH: 1.0000 | MEDICATED_PATCH | CUTANEOUS | 0 refills | Status: DC

## 2023-06-23 MED ORDER — KETOROLAC TROMETHAMINE 15 MG/ML IJ SOLN
15.0000 mg | Freq: Once | INTRAMUSCULAR | Status: AC
  Administered 2023-06-23: 15 mg via INTRAMUSCULAR
  Filled 2023-06-23: qty 1

## 2023-06-23 NOTE — ED Triage Notes (Signed)
Pt c/o sciatica flare-up on R side. Pain in R buttock that shoots down to knee.  Aox4  No urinary incontinence

## 2023-06-23 NOTE — ED Provider Notes (Signed)
[place] EMERGENCY DEPARTMENT AT [facility] Provider Note   CSN: [id number] Arrival date & time: 06/23/23  6213     History  Chief Complaint  Patient presents with   Sciatica    Cindy Boyer is a 58 y.o. female patient with past medical history of osteoarthritis, back pain reporting to the emergency room with right sided low back pain that started yesterday.  Patient reports the pain started gradually and started as a little "twinge" but gradually progressed into pretty significant discomfort with radiating symptoms down the back of her right leg to posterior knee.  Patient has history of sciatica in the past, ports this feels similar.  She has had no recent injury trauma or falls.  Patient has orthopedic follow-up scheduled on the 28th of this month.  Patient reports she has not tried anything for her symptoms.  Patient reports at rest she is in mild pain but when she moves it is an 8 out of 10.  Denies any fevers or chills.  Denies any saddle anesthesia.  Denies any loss of bowel or bladder.  HPI     Home Medications Prior to Admission medications   Medication Sig Start Date End Date Taking? Authorizing Provider  azithromycin (ZITHROMAX) 250 MG tablet Take 2 tablets on day 1, then 1 tablet daily on days 2 through 5 06/19/23 06/24/23  Margaretann Loveless, PA-C  baclofen (LIORESAL) 10 MG tablet Take 1 tablet (10 mg total) by mouth 3 (three) times daily. 06/22/23   Claiborne Rigg, NP  cyanocobalamin (VITAMIN B12) 1000 MCG tablet Take 1 tablet (1,000 mcg total) by mouth daily. 05/20/23   Salvatore Decent, FNP  FEROSUL 325 (65 Fe) MG tablet TAKE 1 TABLET BY MOUTH DAILY WITH BREAKFAST Patient not taking: Reported on 05/20/2023 08/29/22   Mliss Sax, MD  lisinopril (ZESTRIL) 40 MG tablet TAKE 1 TABLET BY MOUTH DAILY 01/29/23   Mliss Sax, MD  meloxicam (MOBIC) 15 MG tablet Take 1 tablet (15 mg total) by mouth daily. 06/06/23 06/05/24  Juanda Chance, NP   Multiple Vitamin (MULTIVITAMIN) capsule Take 1 capsule by mouth daily. Patient not taking: Reported on 05/20/2023    [provider]  predniSONE (STERAPRED UNI-PAK 21 TAB) 10 MG (21) TBPK tablet Take as directed on packaging. 05/20/23   Salvatore Decent, FNP  Vitamin D, Ergocalciferol, (DRISDOL) 1.25 MG (50000 UNIT) CAPS capsule TAKE ONE CAPSULE BY MOUTH ONCE WEEKLY Patient not taking: Reported on 05/20/2023 02/23/22   Mliss Sax, MD      Allergies    Penicillins and Permethrin    Review of Systems   Review of Systems  Physical Exam Updated Vital Signs BP (!) 181/87 (BP Location: Right Arm)   Pulse (!) 102   Temp 97.7 F (36.5 C) (Oral)   Resp 16   Ht 5\' 7"  (1.702 m)   Wt 99.8 kg   LMP 03/21/2011   SpO2 96%   BMI 34.46 kg/m  Physical Exam Vitals and nursing note reviewed.  Constitutional:      General: She is not in acute distress.    Appearance: She is not toxic-appearing.  HENT:     Head: Normocephalic and atraumatic.  Eyes:     General: No scleral icterus.    Conjunctiva/sclera: Conjunctivae normal.  Cardiovascular:     Rate and Rhythm: Normal rate and regular rhythm.     Pulses: Normal pulses.     Heart sounds: Normal heart sounds.  Pulmonary:  Effort: Pulmonary effort is normal. No respiratory distress.     Breath sounds: Normal breath sounds.  Abdominal:     General: Abdomen is flat. Bowel sounds are normal.     Palpations: Abdomen is soft.     Tenderness: There is no abdominal tenderness.  Musculoskeletal:     Right lower leg: No edema.     Left lower leg: No edema.     Comments: Sensation of lower extremities equal bilaterally.  Strong dorsal pedal pulse equal bilaterally.  Patient does have significant pain with right-sided leg raise.  Tenderness to palpation of her right sided low back.  No midline tenderness.  Skin:    General: Skin is warm and dry.     Findings: No lesion.  Neurological:     General: No focal deficit present.      Mental Status: She is alert and oriented to person, place, and time. Mental status is at baseline.     ED Results / Procedures / Treatments   Labs (all labs ordered are listed, but only abnormal results are displayed) Labs Reviewed - No data to display  EKG None  Radiology No results found.  Procedures Procedures    Medications Ordered in ED Medications - No data to display  ED Course/ Medical Decision Making/ A&P Clinical Course as of 06/23/23 1719  Sun Jun 23, 2023  1214 Reassessed, patients pain had improved, feels comfortable going home. Has appropriate follow up.  [JB]    Clinical Course User Index [JB] April Carlyon, Horald Chestnut, PA-C                                 Medical Decision Making Risk Prescription drug management.   Rolland Porter 58 y.o. presented today for back pain. Working Ddx: MSK in nature, fracture, epidural hematoma/abscess, cauda equina syndrome, spinal stenosis, spinal malignancy, discitis, spinal infection, spondylitises/ spondylosis, conus medullaris, DDD of the back.  R/o DDx: Cauda equina syndrome and additional dx are less likely than current impression due to history of present illness, physical exam, labs/imaging findings. No focal neurological deficits, no loss of bowel or bladder control.  Denies fever, night sweats, weight loss, h/o cancer, IVDU.  PMHX: Sciatic nerve pain, hypertension, osteoarthritis    Review of prior external notes: Reviewed ED visit yesterday with primary care for same symptoms and 06/05/22 in which she was diagnosed with SI joint dysfunction   Labs: None  Imaging: considered, however no red flag symptoms and patient has close orthopedic follow up.   Problem List / ED Course / Critical interventions / Medication management  Presenting for low back pain. Hemodynamically stable, well appearing.  She has history of same thing.  Has radicular symptoms.  Patient denies any injury trauma or falls.  Has no red flag symptoms  associated with back pain.  Symptoms improved after treatment here in ED and she is close follow-up with orthopedics in 1 week.  Patient felt comfortable going home and requesting discharge.  I ordered medication including Toradol, Robaxin, lidocaine patch and Norco.  Able to stand up in room and ambulate.  Moving extremities more comfortably.  No weakness. Reevaluation of the patient after these medicines showed that the patient improved Patients vitals assessed. Upon arrival patient is hemodynamically stable.  I have reviewed the patients home medicines and have made adjustments as needed  Consult: none    Plan:  F/u w/ PCP in 2-3d to ensure  resolution of sx.  RICE protocol and pain medicine discussed with patient.  Patient was given return precautions. Patient stable for discharge at this time.  Patient educated on sx/dx and verbalized understanding of plan. Return to ER with new or worsening sx.          Final Clinical Impression(s) / ED Diagnoses Final diagnoses:  Acute right-sided low back pain with right-sided sciatica    Rx / DC Orders ED Discharge Orders     None         Smitty Knudsen, PA-C 06/23/23 1722    Margarita Grizzle, MD 06/27/23 1046

## 2023-06-23 NOTE — Discharge Instructions (Addendum)
Seen in the emergency room today for back pain.  I will send you home a short course of steroids.  I also recommend taking muscle relaxer but do not take this medicine when you are operating heavy machinery or driving.  Muscle relaxers called Robaxin.  I have also sent you home with lidocaine patches to use as needed over the area of pain. Alternate Tylenol and Ibuprofen, ice and heat.  If you have breakthrough pain even with this treatment you can take Norco.

## 2023-07-02 ENCOUNTER — Other Ambulatory Visit: Payer: Self-pay

## 2023-07-02 ENCOUNTER — Ambulatory Visit: Payer: Managed Care, Other (non HMO) | Admitting: Physical Medicine and Rehabilitation

## 2023-07-02 DIAGNOSIS — M461 Sacroiliitis, not elsewhere classified: Secondary | ICD-10-CM | POA: Diagnosis not present

## 2023-07-02 MED ORDER — METHYLPREDNISOLONE ACETATE 40 MG/ML IJ SUSP
40.0000 mg | INTRAMUSCULAR | Status: AC | PRN
Start: 2023-07-02 — End: 2023-07-02
  Administered 2023-07-02: 40 mg via INTRA_ARTICULAR

## 2023-07-02 MED ORDER — BUPIVACAINE HCL 0.5 % IJ SOLN
2.0000 mL | INTRAMUSCULAR | Status: AC | PRN
Start: 2023-07-02 — End: 2023-07-02
  Administered 2023-07-02: 2 mL via INTRA_ARTICULAR

## 2023-07-02 NOTE — Patient Instructions (Signed)

## 2023-07-02 NOTE — Progress Notes (Signed)
Cindy Boyer - 58 y.o. female MRN [id number]  Date of birth: [birth year]  Office Visit Note: Visit Date: 07/02/2023 PCP: Mliss Sax, MD Referred by: Mliss Sax,*  Subjective: Chief Complaint  Patient presents with   Lower Back - Pain   HPI:  Cindy Boyer is a 58 y.o. female who comes in today at the request of Ellin Goodie, FNP for planned Right anesthetic Sacroiliac joint arthrogram with fluoroscopic guidance.  The patient has failed conservative care including home exercise, medications, time and activity modification.  This injection will be diagnostic and hopefully therapeutic.  Please see requesting physician notes for further details and justification.  Work note today for 1 week. If no help will need Lumbar spine MRI and or consider Rheum screen bloodwork.  Positive Fortin finger sign, Patrick's testing and lateral compression test.    ROS Otherwise per HPI.  Assessment & Plan: Visit Diagnoses:    ICD-10-CM   1. Sacroiliitis (HCC)  M46.1 Sacroiliac Joint Inj    XR C-ARM NO REPORT      Plan: No additional findings.   Meds & Orders: No orders of the defined types were placed in this encounter.   Orders Placed This Encounter  Procedures   Sacroiliac Joint Inj   XR C-ARM NO REPORT    Follow-up: Return if symptoms worsen or fail to improve.   Procedures: Right Sacroiliac Joint Inj on 07/02/2023 1:07 PM Indications: pain and diagnostic evaluation Details: 22 G 3.5 in needle, fluoroscopy-guided posterior approach Medications: 2 mL bupivacaine 0.5 %; 40 mg methylPREDNISolone acetate 40 MG/ML Outcome: tolerated well, no immediate complications  Sacroiliac Joint Intra-Articular Injection - Posterior Approach with Fluoroscopic Guidance   Position: PRONE  Additional Comments: Vital signs were monitored before and after the procedure. Patient was prepped and draped in the usual sterile fashion. The correct patient, procedure, and site was  verified.   Injection Procedure Details:   Location/Site:  Sacroiliac joint  Needle size: 3.5 in Spinal Needle  Needle type: Spinal  Needle Placement: Intra-articular  Findings:  -Comments: There was excellent flow of contrast producing a partial arthrogram of the sacroiliac joint.   Procedure Details: Starting with a 90 degree vertical and midline orientation the fluoroscope was tilted cranially 20 to 25 degrees and the target area of the inferior most part of the SI joint on the side mentioned above was visualized.  The soft tissues overlying this target were infiltrated with 4 ml. of 1% Lidocaine without Epinephrine. A #22 gauge spinal needle was inserted perpendicular to the fluoroscope table and advanced into the posterior inferior joint space using fluoroscopic guidance.  Position in the joint space was confirmed by obtaining a partial arthrogram using a 2 ml. volume of Isovue-250 contrast agent. After negative aspirate for gross pus or blood, the injectate was delivered to the joint. Radiographs were obtained for documentation purposes.   Additional Comments:   Dressing: Bandaid    Post-procedure details: Patient was observed during the procedure. Post-procedure instructions were reviewed.  Patient left the clinic in stable condition.    There was excellent flow of contrast producing a partial arthrogram of the sacroiliac joint.  Procedure, treatment alternatives, risks and benefits explained, specific risks discussed. Consent was given by the patient. Immediately prior to procedure a time out was called to verify the correct patient, procedure, equipment, support staff and site/side marked as required. Patient was prepped and draped in the usual sterile fashion.  Clinical History: No specialty comments available.     Objective:  VS:  HT:    WT:   BMI:     BP:   HR: bpm  TEMP: ( )  RESP:  Physical Exam   Imaging: No results found.

## 2023-07-12 ENCOUNTER — Telehealth: Payer: Self-pay | Admitting: Physical Medicine and Rehabilitation

## 2023-07-12 DIAGNOSIS — M461 Sacroiliitis, not elsewhere classified: Secondary | ICD-10-CM

## 2023-07-12 NOTE — Telephone Encounter (Signed)
 Patient request appointment for a injection on there right side this time.

## 2023-07-13 NOTE — Telephone Encounter (Signed)
 Last injection was a sacroiliac joint injection on the right and I did verify the images and the report.  I will go ahead and put him referral/order for left-sided sacroiliac joint injection and depending on which side hurts when she comes in and we could make that choice.

## 2023-08-05 ENCOUNTER — Telehealth: Payer: Self-pay | Admitting: Physical Medicine and Rehabilitation

## 2023-08-05 NOTE — Telephone Encounter (Signed)
 Pt returned call to [name] to set an appt. Informed pt Cindy Boyer will call after lunch. Pt phone number is [phone].

## 2023-08-12 ENCOUNTER — Ambulatory Visit: Admitting: Physical Medicine and Rehabilitation

## 2023-08-12 ENCOUNTER — Other Ambulatory Visit: Payer: Self-pay

## 2023-08-12 VITALS — BP 165/85 | HR 98

## 2023-08-12 DIAGNOSIS — M461 Sacroiliitis, not elsewhere classified: Secondary | ICD-10-CM | POA: Diagnosis not present

## 2023-08-12 DIAGNOSIS — M533 Sacrococcygeal disorders, not elsewhere classified: Secondary | ICD-10-CM

## 2023-08-12 NOTE — Progress Notes (Signed)
 Cindy Boyer - 58 y.o. female MRN [id number]  Date of birth: 1965-10-08  Office Visit Note: Visit Date: 08/12/2023 PCP: Mliss Sax, MD Referred by: Mliss Sax,*  Subjective: Chief Complaint  Patient presents with   Left Hip - Pain   HPI:  Cindy Boyer is a 58 y.o. female who comes in today at the request of Ellin Goodie, FNP for planned Left anesthetic Sacroiliac joint arthrogram with fluoroscopic guidance.  The patient has failed conservative care including home exercise, medications, time and activity modification.  This injection will be diagnostic and hopefully therapeutic.  Please see requesting physician notes for further details and justification.   Positive Fortin finger sign, Patrick's testing and lateral compression test.    ROS Otherwise per HPI.  Assessment & Plan: Visit Diagnoses:    ICD-10-CM   1. Sacroiliitis (HCC)  M46.1 Sacroiliac Joint Inj    XR C-ARM NO REPORT    2. Sacroiliac joint dysfunction of left side  M53.3 Sacroiliac Joint Inj    XR C-ARM NO REPORT      Plan: No additional findings.   Meds & Orders: No orders of the defined types were placed in this encounter.   Orders Placed This Encounter  Procedures   Sacroiliac Joint Inj   XR C-ARM NO REPORT    Follow-up: Return if symptoms worsen or fail to improve.   Procedures: Sacroiliac Joint Inj, Left on 08/12/2023 10:37 AM Indications: pain and diagnostic evaluation Details: 22 G 3.5 in needle, fluoroscopy-guided posterior approach Medications: 2 mL bupivacaine 0.5 %; 80 mg methylPREDNISolone acetate 80 MG/ML; 40 mg methylPREDNISolone acetate 40 MG/ML Outcome: tolerated well, no immediate complications  Sacroiliac Joint Intra-Articular Injection - Posterior Approach with Fluoroscopic Guidance   Position: PRONE  Additional Comments: Vital signs were monitored before and after the procedure. Patient was prepped and draped in the usual sterile fashion. The correct  patient, procedure, and site was verified.   Injection Procedure Details:   Location/Site:  Sacroiliac joint  Needle size: 3.5 in Spinal Needle  Needle type: Spinal  Needle Placement: Intra-articular  Findings:  -Comments: There was excellent flow of contrast producing a partial arthrogram of the sacroiliac joint.   Procedure Details: Starting with a 90 degree vertical and midline orientation the fluoroscope was tilted cranially 20 to 25 degrees and the target area of the inferior most part of the SI joint on the side mentioned above was visualized.  The soft tissues overlying this target were infiltrated with 4 ml. of 1% Lidocaine without Epinephrine. A #22 gauge spinal needle was inserted perpendicular to the fluoroscope table and advanced into the posterior inferior joint space using fluoroscopic guidance.  Position in the joint space was confirmed by obtaining a partial arthrogram using a 2 ml. volume of Isovue-250 contrast agent. After negative aspirate for gross pus or blood, the injectate was delivered to the joint. Radiographs were obtained for documentation purposes.   Additional Comments:   Dressing: Bandaid    Post-procedure details: Patient was observed during the procedure. Post-procedure instructions were reviewed.  Patient left the clinic in stable condition.    There was excellent flow of contrast producing a partial arthrogram of the sacroiliac joint.  Procedure, treatment alternatives, risks and benefits explained, specific risks discussed. Consent was given by the patient. Immediately prior to procedure a time out was called to verify the correct patient, procedure, equipment, support staff and site/side marked as required. Patient was prepped and draped in the usual sterile fashion.  Clinical History: No specialty comments available.     Objective:  VS:  HT:    WT:   BMI:     BP:(!) 165/85  HR:98bpm  TEMP: ( )  RESP:  Physical Exam    Imaging: No results found.

## 2023-08-12 NOTE — Progress Notes (Signed)
 Pain Scale   Average Pain 3

## 2023-08-20 MED ORDER — BUPIVACAINE HCL 0.5 % IJ SOLN
2.0000 mL | INTRAMUSCULAR | Status: AC | PRN
Start: 2023-08-12 — End: 2023-08-12
  Administered 2023-08-12: 2 mL via INTRA_ARTICULAR

## 2023-08-20 MED ORDER — METHYLPREDNISOLONE ACETATE 80 MG/ML IJ SUSP
80.0000 mg | INTRAMUSCULAR | Status: AC | PRN
Start: 2023-08-12 — End: 2023-08-12
  Administered 2023-08-12: 80 mg via INTRA_ARTICULAR

## 2023-08-20 MED ORDER — METHYLPREDNISOLONE ACETATE 40 MG/ML IJ SUSP
40.0000 mg | INTRAMUSCULAR | Status: AC | PRN
Start: 2023-08-12 — End: 2023-08-12
  Administered 2023-08-12: 40 mg via INTRA_ARTICULAR

## 2024-01-20 ENCOUNTER — Other Ambulatory Visit: Payer: Self-pay | Admitting: Family Medicine

## 2024-01-20 DIAGNOSIS — I10 Essential (primary) hypertension: Secondary | ICD-10-CM

## 2024-02-12 ENCOUNTER — Other Ambulatory Visit: Payer: Self-pay | Admitting: Family Medicine

## 2024-02-12 DIAGNOSIS — I10 Essential (primary) hypertension: Secondary | ICD-10-CM

## 2024-02-17 ENCOUNTER — Other Ambulatory Visit: Payer: Self-pay | Admitting: Family Medicine

## 2024-02-17 DIAGNOSIS — I10 Essential (primary) hypertension: Secondary | ICD-10-CM

## 2024-04-06 ENCOUNTER — Encounter: Payer: Self-pay | Admitting: Radiology

## 2024-04-25 ENCOUNTER — Emergency Department (HOSPITAL_COMMUNITY)

## 2024-04-25 ENCOUNTER — Other Ambulatory Visit: Payer: Self-pay

## 2024-04-25 ENCOUNTER — Emergency Department (HOSPITAL_COMMUNITY)
Admission: EM | Admit: 2024-04-25 | Discharge: 2024-04-26 | Discharge status: Against Medical Advice | Attending: Emergency Medicine | Admitting: Emergency Medicine

## 2024-04-25 ENCOUNTER — Encounter (HOSPITAL_COMMUNITY): Payer: Self-pay | Admitting: *Deleted

## 2024-04-25 DIAGNOSIS — R002 Palpitations: Secondary | ICD-10-CM | POA: Insufficient documentation

## 2024-04-25 DIAGNOSIS — R0789 Other chest pain: Secondary | ICD-10-CM | POA: Diagnosis present

## 2024-04-25 DIAGNOSIS — Z5321 Procedure and treatment not carried out due to patient leaving prior to being seen by health care provider: Secondary | ICD-10-CM | POA: Insufficient documentation

## 2024-04-25 DIAGNOSIS — R0602 Shortness of breath: Secondary | ICD-10-CM | POA: Diagnosis not present

## 2024-04-25 DIAGNOSIS — R1110 Vomiting, unspecified: Secondary | ICD-10-CM | POA: Diagnosis not present

## 2024-04-25 LAB — CBC
HCT: 39.6 % (ref 36.0–46.0)
Hemoglobin: 12.9 g/dL (ref 12.0–15.0)
MCH: 31.1 pg (ref 26.0–34.0)
MCHC: 32.6 g/dL (ref 30.0–36.0)
MCV: 95.4 fL (ref 80.0–100.0)
Platelets: 377 K/uL (ref 150–400)
RBC: 4.15 MIL/uL (ref 3.87–5.11)
RDW: 12.6 % (ref 11.5–15.5)
WBC: 8.7 K/uL (ref 4.0–10.5)
nRBC: 0 % (ref 0.0–0.2)

## 2024-04-25 NOTE — ED Triage Notes (Signed)
 Arrived with EMS from home for CP, sob, and palpitations with one episode of emesis. Pt reports having a stressful day at work and doesn't have hx of anxiety but started feeling her heart racing and couldn't catch her breath. Pt has mild chest tightness at this time      EMS VS 146/70, pulse 107, cbg 115, 98%RA

## 2024-04-26 LAB — HEPATIC FUNCTION PANEL
ALT: 22 U/L (ref 0–44)
AST: 28 U/L (ref 15–41)
Albumin: 3.5 g/dL (ref 3.5–5.0)
Alkaline Phosphatase: 61 U/L (ref 38–126)
Bilirubin, Direct: <0.1 mg/dL (ref 0.0–0.2)
Total Bilirubin: 0.4 mg/dL (ref 0.0–1.2)
Total Protein: 7.2 g/dL (ref 6.5–8.1)

## 2024-04-26 LAB — BASIC METABOLIC PANEL WITH GFR
Anion gap: 15 (ref 5–15)
BUN: 20 mg/dL (ref 6–20)
CO2: 20 mmol/L — ABNORMAL LOW (ref 22–32)
Calcium: 8.8 mg/dL — ABNORMAL LOW (ref 8.9–10.3)
Chloride: 104 mmol/L (ref 98–111)
Creatinine, Ser: 0.97 mg/dL (ref 0.44–1.00)
GFR, Estimated: >60 mL/min (ref >60)
Glucose, Bld: 100 mg/dL — ABNORMAL HIGH (ref 70–99)
Potassium: 3.8 mmol/L (ref 3.5–5.1)
Sodium: 139 mmol/L (ref 135–145)

## 2024-04-26 LAB — TROPONIN I (HIGH SENSITIVITY)
Troponin I (High Sensitivity): 6 ng/L (ref <18)
Troponin I (High Sensitivity): 7 ng/L (ref <18)

## 2024-04-26 LAB — LIPASE, BLOOD: Lipase: 32 U/L (ref 11–51)

## 2024-04-26 NOTE — ED Notes (Signed)
 Pt stated that she was leaving, she stated that she has been her to long.

## 2024-05-07 ENCOUNTER — Ambulatory Visit: Admitting: Nurse Practitioner

## 2024-05-07 ENCOUNTER — Ambulatory Visit: Payer: Self-pay

## 2024-05-07 VITALS — BP 162/90 | HR 97 | Temp 98.0°F | Ht 67.0 in | Wt 234.0 lb

## 2024-05-07 DIAGNOSIS — I10 Essential (primary) hypertension: Secondary | ICD-10-CM

## 2024-05-07 DIAGNOSIS — J42 Unspecified chronic bronchitis: Secondary | ICD-10-CM | POA: Insufficient documentation

## 2024-05-07 DIAGNOSIS — J40 Bronchitis, not specified as acute or chronic: Secondary | ICD-10-CM | POA: Diagnosis not present

## 2024-05-07 MED ORDER — LISINOPRIL 40 MG PO TABS: 40.0000 mg | ORAL_TABLET | Freq: Every day | ORAL | 0 refills | Status: AC

## 2024-05-07 MED ORDER — ALBUTEROL SULFATE HFA 108 (90 BASE) MCG/ACT IN AERS: 2.0000 | INHALATION_SPRAY | Freq: Four times a day (QID) | RESPIRATORY_TRACT | 0 refills | Status: DC | PRN

## 2024-05-07 MED ORDER — PREDNISONE 20 MG PO TABS: 40.0000 mg | ORAL_TABLET | Freq: Every day | ORAL | 0 refills | Status: DC

## 2024-05-07 NOTE — Assessment & Plan Note (Signed)
 Chronic, not controlled. Her hypertension is linked to recent non-adherence to medication, with blood pressure elevated at 162/90 mmHg. Stress and recent illness may contribute. Refill lisinopril  40mg  daily. Recent BMP reviewed from ER. Encourage follow-up for ongoing management and monitoring.

## 2024-05-07 NOTE — Progress Notes (Signed)
 Acute Office Visit  Subjective:     Patient ID: Cindy Boyer, female    DOB: [birth year], 58 y.o.   MRN: [id number]  Chief Complaint  Patient presents with   Cough    Productive cough for 1 week with pain in chest and back, SOB    HPI Discussed the use of AI scribe software for clinical note transcription with the patient, who gave verbal consent to proceed.  History of Present Illness   Cindy Boyer is a 58 year old female who presents with worsening shortness of breath and cough.  Over the past week she has had worsening runny nose, sneezing, and a mainly morning productive cough. The cough is now associated with shortness of breath and chest pain that radiates between her shoulder blades, worse with coughing and deep breaths, which she describes as soreness.  She quit smoking 8 years ago but has ongoing difficulty breathing that is worse in summer and has recently worsened. Shortness of breath is now triggered by minimal exertion such as walking across the floor, and she hears herself wheezing, which is more pronounced when she is sick.  She went to the emergency room last week for concern about a heart attack. She reports high stress from working seven days a week and caregiving for her husband, along with poor appetite and sleep disturbance due to pain. Labs were normal.   She has not taken her blood pressure medication since September. She is using Coricidin HBP for her current symptoms.     ROS See pertinent positives and negatives per HPI.    Objective:    BP (!) 162/90 (BP Location: Left Arm, Cuff Size: Large)   Pulse 97   Temp 98 F (36.7 C) (Oral)   Ht 5' 7 (1.702 m)   Wt 234 lb (106.1 kg)   LMP 03/21/2011   SpO2 99%   BMI 36.65 kg/m    Physical Exam Vitals and nursing note reviewed.  Constitutional:      General: She is not in acute distress.    Appearance: Normal appearance.  HENT:     Head: Normocephalic.     Right Ear: Tympanic membrane, ear  canal and external ear normal.     Left Ear: Tympanic membrane, ear canal and external ear normal.     Mouth/Throat:     Mouth: Mucous membranes are moist.     Pharynx: Posterior oropharyngeal erythema present. No oropharyngeal exudate.  Eyes:     Conjunctiva/sclera: Conjunctivae normal.  Cardiovascular:     Rate and Rhythm: Normal rate and regular rhythm.     Pulses: Normal pulses.     Heart sounds: Normal heart sounds.  Pulmonary:     Effort: Pulmonary effort is normal.     Breath sounds: Normal breath sounds.  Musculoskeletal:     Cervical back: Normal range of motion and neck supple. No tenderness.  Lymphadenopathy:     Cervical: No cervical adenopathy.  Skin:    General: Skin is warm.  Neurological:     General: No focal deficit present.     Mental Status: She is alert and oriented to person, place, and time.  Psychiatric:        Mood and Affect: Mood normal.        Behavior: Behavior normal.        Thought Content: Thought content normal.        Judgment: Judgment normal.       Assessment & Plan:  Problem List Items Addressed This Visit       Cardiovascular and Mediastinum   Essential hypertension   Chronic, not controlled. Her hypertension is linked to recent non-adherence to medication, with blood pressure elevated at 162/90 mmHg. Stress and recent illness may contribute. Refill lisinopril  40mg  daily. Recent BMP reviewed from ER. Encourage follow-up for ongoing management and monitoring.       Relevant Medications   lisinopril  (ZESTRIL ) 40 MG tablet     Respiratory   Bronchitis - Primary   Acute bronchitis is exacerbating her chronic dyspnea and wheezing, likely due to viral or allergic causes. Prescribe prednisone , 40mg  in the morning for 5 days with food. Use an albuterol  inhaler every 6 hours as needed for shortness of breath or wheezing. Plan baseline lung function tests at follow-up to assess for COPD or asthma.       Meds ordered this encounter   Medications   lisinopril  (ZESTRIL ) 40 MG tablet    Sig: Take 1 tablet (40 mg total) by mouth daily.    Dispense:  90 tablet    Refill:  0   predniSONE  (DELTASONE ) 20 MG tablet    Sig: Take 2 tablets (40 mg total) by mouth daily with breakfast.    Dispense:  10 tablet    Refill:  0   albuterol  (VENTOLIN  HFA) 108 (90 Base) MCG/ACT inhaler    Sig: Inhale 2 puffs into the lungs every 6 (six) hours as needed for wheezing or shortness of breath.    Dispense:  8 g    Refill:  0    Return in about 4 weeks (around 06/04/2024) for TOC to me.  Tinnie DELENA Harada, NP

## 2024-05-07 NOTE — Patient Instructions (Signed)
 It was great to see you!  I have refilled your blood pressure medicine   Start prednisone  2 tablets daily with food   Start albuterol  inhaler every 6 hours as needed for shortness of breath or wheezing   Let's follow-up when able to transfer care to me  Take care,  Tinnie Harada, NP

## 2024-05-07 NOTE — Telephone Encounter (Signed)
 FYI Only or Action Required?: FYI only for provider: appointment scheduled on 05/07/24.  Patient was last seen in primary care on 05/20/2023 by Cindy Knee, FNP.  Called Nurse Triage reporting Shortness of Breath and Cough.  Symptoms began a week ago.  Interventions attempted: Nothing.  Symptoms are: gradually worsening.  Triage Disposition: See HCP Within 4 Hours (Or PCP Triage)  Patient/caregiver understands and will follow disposition?: Yes             Copied from CRM #[id number]. Topic: Clinical - Red Word Triage >> May 07, 2024  8:29 AM Cindy Boyer wrote: Red Word that prompted transfer to Nurse Triage: Pt called in stating that she has been having shortness of breath, worsening cough and pain through shoulder blades. Pt also stated it hurts when she breathes. Pt denied having any other symptoms at this time. Warm transferred to nurse triage. Reason for Disposition  [1] MILD difficulty breathing (e.g., minimal/no SOB at rest, SOB with walking, pulse < 100) AND [2] NEW-onset or WORSE than normal  Answer Assessment - Initial Assessment Questions 1. RESPIRATORY STATUS: Describe your breathing? (e.g., wheezing, shortness of breath, unable to speak, severe coughing)      Cough and SOB. Cough is productive in the morning, pink/streak of red recently. Chest and upper back hurt from coughing. Wheezing in the morning.  2. ONSET: When did this breathing problem begin?      X 1 week.  3. PATTERN Does the difficult breathing come and go, or has it been constant since it started?      Comes and goes. Notices SOB with activity/exertion.  4. SEVERITY: How bad is your breathing? (e.g., mild, moderate, severe)      Mild. Patient speaking in full sentences, occasional cough noted and no labored breathing or wheezing noted.  5. RECURRENT SYMPTOM: Have you had difficulty breathing before? If Yes, ask: When was the last time? and What happened that time?      She states 3  years ago she had something similar and was treated with antibiotics.  6. CARDIAC HISTORY: Do you have any history of heart disease? (e.g., heart attack, angina, bypass surgery, angioplasty)      No.  7. LUNG HISTORY: Do you have any history of lung disease?  (e.g., pulmonary embolus, asthma, emphysema)     Former smoker and thinks she has early COPD but no diagnosis.  8. CAUSE: What do you think is causing the breathing problem?      Thinks it could be a lung infection or cold that has worsened.  9. OTHER SYMPTOMS: Do you have any other symptoms? (e.g., chest pain, cough, dizziness, fever, runny nose)     Watery eyes, sneezing, hypertension (was 140/70 in ED last week, went to ED due to thought she was having a heart attack and was told it was a panic attack). No fever.  10. O2 SATURATION MONITOR:  Do you use an oxygen saturation monitor (pulse oximeter) at home? If Yes, ask: What is your reading (oxygen level) today? What is your usual oxygen saturation reading? (e.g., 95%)       No. 11. PREGNANCY: Is there any chance you are pregnant? When was your last menstrual period?       N/A. 12. TRAVEL: Have you traveled out of the country in the last month? (e.g., travel history, exposures)       No.  Protocols used: Breathing Difficulty-A-AH

## 2024-05-07 NOTE — Assessment & Plan Note (Signed)
 Acute bronchitis is exacerbating her chronic dyspnea and wheezing, likely due to viral or allergic causes. Prescribe prednisone , 40mg  in the morning for 5 days with food. Use an albuterol  inhaler every 6 hours as needed for shortness of breath or wheezing. Plan baseline lung function tests at follow-up to assess for COPD or asthma.

## 2024-05-08 NOTE — Telephone Encounter (Signed)
 Noted

## 2024-05-13 ENCOUNTER — Emergency Department (HOSPITAL_COMMUNITY)

## 2024-05-13 ENCOUNTER — Other Ambulatory Visit: Payer: Self-pay

## 2024-05-13 ENCOUNTER — Ambulatory Visit: Payer: Self-pay

## 2024-05-13 ENCOUNTER — Emergency Department (HOSPITAL_COMMUNITY): Admission: EM | Admit: 2024-05-13 | Discharge: 2024-05-13 | Disposition: A | Discharge status: Home / Self Care

## 2024-05-13 ENCOUNTER — Encounter: Payer: Self-pay | Admitting: Nurse Practitioner

## 2024-05-13 ENCOUNTER — Encounter (HOSPITAL_COMMUNITY): Payer: Self-pay

## 2024-05-13 DIAGNOSIS — U071 COVID-19: Secondary | ICD-10-CM | POA: Diagnosis not present

## 2024-05-13 DIAGNOSIS — I10 Essential (primary) hypertension: Secondary | ICD-10-CM | POA: Insufficient documentation

## 2024-05-13 DIAGNOSIS — R059 Cough, unspecified: Secondary | ICD-10-CM | POA: Diagnosis present

## 2024-05-13 DIAGNOSIS — J449 Chronic obstructive pulmonary disease, unspecified: Secondary | ICD-10-CM | POA: Insufficient documentation

## 2024-05-13 DIAGNOSIS — Z79899 Other long term (current) drug therapy: Secondary | ICD-10-CM | POA: Insufficient documentation

## 2024-05-13 LAB — CBC
HCT: 39.8 % (ref 36.0–46.0)
Hemoglobin: 12.6 g/dL (ref 12.0–15.0)
MCH: 30.8 pg (ref 26.0–34.0)
MCHC: 31.7 g/dL (ref 30.0–36.0)
MCV: 97.3 fL (ref 80.0–100.0)
Platelets: 422 K/uL — ABNORMAL HIGH (ref 150–400)
RBC: 4.09 MIL/uL (ref 3.87–5.11)
RDW: 12.3 % (ref 11.5–15.5)
WBC: 8.1 K/uL (ref 4.0–10.5)
nRBC: 0 % (ref 0.0–0.2)

## 2024-05-13 LAB — BASIC METABOLIC PANEL WITH GFR
Anion gap: 12 (ref 5–15)
BUN: 14 mg/dL (ref 6–20)
CO2: 25 mmol/L (ref 22–32)
Calcium: 9.5 mg/dL (ref 8.9–10.3)
Chloride: 103 mmol/L (ref 98–111)
Creatinine, Ser: 1.06 mg/dL — ABNORMAL HIGH (ref 0.44–1.00)
GFR, Estimated: >60 mL/min (ref >60)
Glucose, Bld: 105 mg/dL — ABNORMAL HIGH (ref 70–99)
Potassium: 4.5 mmol/L (ref 3.5–5.1)
Sodium: 140 mmol/L (ref 135–145)

## 2024-05-13 LAB — RESP PANEL BY RT-PCR (RSV, FLU A&B, COVID)  RVPGX2
Influenza A by PCR: NEGATIVE
Influenza B by PCR: NEGATIVE
Resp Syncytial Virus by PCR: NEGATIVE
SARS Coronavirus 2 by RT PCR: POSITIVE — AB

## 2024-05-13 MED ORDER — IPRATROPIUM-ALBUTEROL 0.5-2.5 (3) MG/3ML IN SOLN
3.0000 mL | Freq: Once | RESPIRATORY_TRACT | Status: AC
  Administered 2024-05-13: 3 mL via RESPIRATORY_TRACT
  Filled 2024-05-13: qty 3

## 2024-05-13 MED ORDER — IOHEXOL 350 MG/ML SOLN
75.0000 mL | Freq: Once | INTRAVENOUS | Status: AC | PRN
  Administered 2024-05-13: 75 mL via INTRAVENOUS

## 2024-05-13 MED ORDER — PAXLOVID (300/100) 20 X 150 MG & 10 X 100MG PO TBPK: 3.0000 | ORAL_TABLET | Freq: Two times a day (BID) | ORAL | 0 refills | Status: AC

## 2024-05-13 MED ORDER — MORPHINE SULFATE (PF) 4 MG/ML IV SOLN
4.0000 mg | Freq: Once | INTRAVENOUS | Status: DC
  Filled 2024-05-13: qty 1

## 2024-05-13 MED ORDER — KETOROLAC TROMETHAMINE 15 MG/ML IJ SOLN: 15.0000 mg | Freq: Once | INTRAMUSCULAR | Status: DC

## 2024-05-13 MED ORDER — ONDANSETRON HCL 4 MG/2ML IJ SOLN
4.0000 mg | Freq: Once | INTRAMUSCULAR | Status: DC
  Filled 2024-05-13: qty 2

## 2024-05-13 MED ORDER — ALBUTEROL SULFATE HFA 108 (90 BASE) MCG/ACT IN AERS: 2.0000 | INHALATION_SPRAY | RESPIRATORY_TRACT | 0 refills | Status: DC | PRN

## 2024-05-13 MED ORDER — METHYLPREDNISOLONE SODIUM SUCC 125 MG IJ SOLR: 125.0000 mg | Freq: Once | INTRAMUSCULAR | Status: DC

## 2024-05-13 NOTE — Telephone Encounter (Signed)
 Noted. Patient advised to go to ED by nurse triage

## 2024-05-13 NOTE — Telephone Encounter (Signed)
 FYI Only or Action Required?: FYI only for provider: ED advised.  Patient was last seen in primary care on 05/07/2024 by Cindy Tinnie LABOR, NP.  Called Nurse Triage reporting Back Pain and Cough.  Symptoms began yesterday.  Interventions attempted: Nothing.  Symptoms are: gradually worsening.  Triage Disposition: Go to ED Now (or PCP Triage)  Patient/caregiver understands and will follow disposition?: Yes   Copied from CRM #[id number]. Topic: Clinical - Red Word Triage >> May 13, 2024  8:30 AM [name] wrote: Red Word that prompted transfer to Nurse Triage: Was diagnosed with acute Bronchitis and it has gotten worst, also sharp pain in back Reason for Disposition  Taking a deep breath makes pain worse  Answer Assessment - Initial Assessment Questions Advised ED now. Patient reports will have husband drive.  Lov 05/07/24  1. RESPIRATORY STATUS: Describe your breathing? (e.g., wheezing, shortness of breath, unable to speak, severe coughing)      Sharp pain in back, under rib cage and shoulder; left side, breathing makes worse. sharp piercing pain when coughing and breathing 2. ONSET: When did this breathing problem begin?      yesterday 3. PATTERN Does the difficult breathing come and go, or has it been constant since it started?      Each time with breathing, coughing bending over  Denies fever, chills, n/v  Answer Assessment - Initial Assessment Questions Advised ED now. Patient reports will have husband drive.  Lov 05/07/24  1. RESPIRATORY STATUS: Describe your breathing? (e.g., wheezing, shortness of breath, unable to speak, severe coughing)      Sharp pain in back, under rib cage and shoulder; left side, breathing makes worse 2. ONSET: When did this breathing problem begin?      yesterday 3. PATTERN Does the difficult breathing come and go, or has it been constant since it started?      Each time with breathing, coughing bending over  Protocols used:  Breathing Difficulty-A-AH, Chest Pain-A-AH

## 2024-05-13 NOTE — ED Notes (Signed)
 Morphine  and Zofran  given prior to discontinuation of the medications.

## 2024-05-13 NOTE — ED Provider Notes (Signed)
 [place] [facility] Provider Note   CSN: [id number] Arrival date & time: 05/13/24  9073     Patient presents with: Back Pain, Cough, Shortness of Breath, and Nasal Congestion   Cindy Boyer is a 58 y.o. female.   58 year old female with past medical history of COPD and hypertension presenting to the emergency department today with pleuritic chest pain.  The patient states that she has been feeling unwell now for the past 4 weeks or so.  The patient states that she has had a productive cough now over the past 2 weeks.  She went to see her primary care provider and was started on prednisone  and albuterol  with minimal if any improvement.  She reports that over the past few days that she has been having pleuritic pain on the left when she takes a deep breath in and out.  States that she is coughing up a lot of sputum.  Denies any hemoptysis.  She states that the shortness of breath with exertion is getting worse.   Back Pain Cough Associated symptoms: shortness of breath   Shortness of Breath Associated symptoms: cough        Prior to Admission medications   Medication Sig Start Date End Date Taking? Authorizing Provider  albuterol  (VENTOLIN  HFA) 108 (90 Base) MCG/ACT inhaler Inhale 2 puffs into the lungs every 4 (four) hours as needed for wheezing or shortness of breath. 05/13/24  Yes Ula Prentice SAUNDERS, MD  nirmatrelvir/ritonavir (PAXLOVID, 300/100,) 20 x 150 MG & 10 x 100MG  TBPK Take 3 tablets by mouth 2 (two) times daily for 5 days. Patient GFR is 60. Take nirmatrelvir (150 mg) two tablets twice daily for 5 days and ritonavir (100 mg) one tablet twice daily for 5 days. 05/13/24 05/18/24 Yes Ula Prentice SAUNDERS, MD  albuterol  (VENTOLIN  HFA) 108 [phone] Base) MCG/ACT inhaler Inhale 2 puffs into the lungs every 6 (six) hours as needed for wheezing or shortness of breath. 05/07/24   McElwee, Lauren A, NP  cyanocobalamin  (VITAMIN B12) 1000 MCG tablet Take 1 tablet  (1,000 mcg total) by mouth daily. 05/20/23   Billy Knee, FNP  lisinopril  (ZESTRIL ) 40 MG tablet Take 1 tablet (40 mg total) by mouth daily. 05/07/24   McElwee, Tinnie LABOR, NP  Multiple Vitamin (MULTIVITAMIN) capsule Take 1 capsule by mouth daily. Patient not taking: Reported on 05/20/2023    [provider]  predniSONE  (DELTASONE ) 20 MG tablet Take 2 tablets (40 mg total) by mouth daily with breakfast. 05/07/24   McElwee, Lauren A, NP    Allergies: Penicillins and Permethrin     Review of Systems  Respiratory:  Positive for cough and shortness of breath.   Musculoskeletal:  Positive for back pain.  All other systems reviewed and are negative.   Updated Vital Signs BP 115/75   Pulse (!) 102   Temp 98.6 F (37 C)   Resp 20   LMP 03/21/2011   SpO2 96%   Physical Exam Vitals and nursing note reviewed.   Gen: Conversational dyspnea noted Eyes: PERRL, EOMI HEENT: no oropharyngeal swelling Neck: trachea midline Resp: Diminished at bilateral lung bases, no reproducible tenderness over the posterior chest wall noted Card: RRR, no murmurs, rubs, or gallops Abd: nontender, nondistended Extremities: no calf tenderness, no edema Vascular: 2+ radial pulses bilaterally, 2+ DP pulses bilaterally Skin: no rashes Psyc: acting appropriately   (all labs ordered are listed, but only abnormal results are displayed) Labs Reviewed  RESP PANEL BY RT-PCR (  RSV, FLU A&B, COVID)  RVPGX2 - Abnormal; Notable for the following components:      Result Value   SARS Coronavirus 2 by RT PCR POSITIVE (*)    All other components within normal limits  BASIC METABOLIC PANEL WITH GFR - Abnormal; Notable for the following components:   Glucose, Bld 105 (*)    Creatinine, Ser 1.06 (*)    All other components within normal limits  CBC - Abnormal; Notable for the following components:   Platelets 422 (*)    All other components within normal limits    EKG: EKG Interpretation Date/Time:  Wednesday  May 13 2024 09:43:30 EST Ventricular Rate:  93 PR Interval:  136 QRS Duration:  80 QT Interval:  342 QTC Calculation: 425 R Axis:   -14  Text Interpretation: Normal sinus rhythm Anterior infarct , age undetermined Abnormal ECG When compared with ECG of 25-Apr-2024 23:13, PREVIOUS ECG IS PRESENT Confirmed by Ula Barter [id number]) on 05/13/2024 3:12:01 PM  Radiology: DG Chest 2 View Result Date: 05/13/2024 CLINICAL DATA:  Shortness of breath and cough. EXAM: DG CHEST 2V COMPARISON:  04/25/2024 FINDINGS: The lungs are clear without focal pneumonia, edema, pneumothorax or pleural effusion. The cardiopericardial silhouette is within normal limits for size. Left upper lobe calcified granuloma is again noted. No acute bony abnormality. IMPRESSION: No active cardiopulmonary disease. Electronically Signed   By: Camellia Candle M.D.   On: 05/13/2024 10:46     Procedures   Medications Ordered in the ED  methylPREDNISolone  sodium succinate (SOLU-MEDROL ) 125 mg/2 mL injection 125 mg (has no administration in time range)  ketorolac  (TORADOL ) 15 MG/ML injection 15 mg (has no administration in time range)  ipratropium-albuterol  (DUONEB) 0.5-2.5 (3) MG/3ML nebulizer solution 3 mL (3 mLs Nebulization Given 05/13/24 1520)  iohexol  (OMNIPAQUE ) 350 MG/ML injection 75 mL (75 mLs Intravenous Contrast Given 05/13/24 1644)                                    Medical Decision Making 58 year old female with past medical history of COPD and hypertension presenting to the emergency department today with pleuritic chest pain.  The patient did have a positive COVID test at triage.  Despite this she is reporting that she has been having symptoms now for the past 3 to 4 weeks and her primary symptom over the last 3 to 4 days is pleuritic pain.  With her COVID test being positive I suspect a D-dimer would be positive regardless.  Will obtain CT angiogram to evaluate for pulmonary embolism.  I will give the patient  Toradol  here for pain as well as Solu-Medrol  and a DuoNeb here.  I will reevaluate for ultimate disposition.  Patient labs are reassuring.  CT scan is pending at time of signout.  Plan is for reevaluation but likely discharge if CT is unremarkable.  Amount and/or Complexity of Data Reviewed Labs: ordered. Radiology: ordered.  Risk Prescription drug management.        Final diagnoses:  COVID-19    ED Discharge Orders          Ordered    nirmatrelvir/ritonavir (PAXLOVID, 300/100,) 20 x 150 MG & 10 x 100MG  TBPK  2 times daily        05/13/24 1624    albuterol  (VENTOLIN  HFA) 108 (90 Base) MCG/ACT inhaler  Every 4 hours PRN        05/13/24 1625  Ula Prentice SAUNDERS, MD 05/13/24 [phone]

## 2024-05-13 NOTE — ED Provider Notes (Signed)
 Patient signed out to me by Dr. Ula pending CTPE. In short this is a 58 year old F with PMH COPD that presented to the ED with cough and pleuritic chest/back pain. She is COVID positive. She has received duoneb and steroids here. Labs otherwise within normal range, CXR without acute disease. She  is hemodynamically stable. Plan for discharge home on Paxlovid if CT is negative.   5:17 PM CT negative for PE. Patient is stable for discharge home with outpatient follow up.   Kingsley, Freddrick Gladson K, DO 05/13/24 1742

## 2024-05-13 NOTE — Discharge Instructions (Addendum)
 It looks like your COVID test was positive.  Your CT scan did not show any acute findings.  Please follow-up with your doctor and return to the ER for worsening symptoms.  Continue to use the albuterol  as prescribed.

## 2024-05-13 NOTE — ED Triage Notes (Addendum)
 Pt. Stated, I've been treated for bronchitis for the last week but I'm not getting better. Im having back pain and SOB with a cough.This all started about 3-4 weeks ago.

## 2024-05-15 ENCOUNTER — Other Ambulatory Visit (HOSPITAL_COMMUNITY): Payer: Self-pay

## 2024-05-15 ENCOUNTER — Telehealth: Payer: Self-pay

## 2024-05-15 NOTE — Telephone Encounter (Signed)
 I called and spoke with patient and told her that she will need to be seen.

## 2024-05-15 NOTE — Telephone Encounter (Signed)
 Copied from CRM #[id number]. Topic: Appointments - Appointment Scheduling >> May 15, 2024  8:22 AM Rosina BIRCH wrote: Patient/patient representative is calling to schedule an appointment. Refer to attachments for appointment information.   Patient called stating she was told to make an ER follow up from her having covid. Patient has a TOC with McElwee on 1/16 and I was going to make an appointment with her, but I was told by CAL that she need to make it with her original provider which is MD Berneta. He does not have anything until 12/23. Patient want know if she can get a note to go back to work on Tuesday [phone]

## 2024-05-18 ENCOUNTER — Ambulatory Visit: Admitting: Emergency Medicine

## 2024-05-18 ENCOUNTER — Encounter: Payer: Self-pay | Admitting: Emergency Medicine

## 2024-05-18 VITALS — BP 134/88 | HR 74 | Resp 18 | Ht 67.0 in | Wt 233.0 lb

## 2024-05-18 DIAGNOSIS — U071 COVID-19: Secondary | ICD-10-CM

## 2024-05-18 NOTE — Progress Notes (Signed)
° °  Assessment & Plan:  COVID-19 Continues to recover and feels ready to return to work tomorrow.       Corean Geralds, MSPAS, PA-C   Subjective:  HPI: Cindy Boyer is a 58 y.o. female with history of  has a past medical history of Allergy, Arthritis, Bronchitis, Cancer (HCC), Clotting disorder, and Environmental allergies. presenting on 05/18/2024 for COVID Follow Up (Dx with COVID. Symptoms are getting better.  Needs a work note to return. ) Seen in the emergency department on 12/10 with pleuritic chest pain, negative CT PE.  She has been recovering at home and would like to return to work tomorrow but will need a note to do so.  She does feel improved each day and does not feeling as short of breath that she was previously.   ROS: Negative unless specifically indicated above in HPI.   Relevant past medical history reviewed and updated as indicated.   Allergies and medications reviewed and updated.  Current Medications[1]  Allergies[2]    Objective:   Vitals:   05/18/24 0938  BP: 134/88  Pulse: 74  Resp: 18  Height: 5' 7 (1.702 m)  Weight: 233 lb (105.7 kg)  SpO2: 97%  BMI (Calculated): 36.48     Appears well, in no acute distress Heart with regular rate and rhythm Normal respiratory effort and excursion.  Lungs are clear to auscultation bilaterally         [1]  Current Outpatient Medications:    albuterol  (VENTOLIN  HFA) 108 (90 Base) MCG/ACT inhaler, Inhale 2 puffs into the lungs every 6 (six) hours as needed for wheezing or shortness of breath., Disp: 8 g, Rfl: 0   albuterol  (VENTOLIN  HFA) 108 (90 Base) MCG/ACT inhaler, Inhale 2 puffs into the lungs every 4 (four) hours as needed for wheezing or shortness of breath., Disp: 1 each, Rfl: 0   cyanocobalamin  (VITAMIN B12) 1000 MCG tablet, Take 1 tablet (1,000 mcg total) by mouth daily., Disp: , Rfl:    lisinopril  (ZESTRIL ) 40 MG tablet, Take 1 tablet (40 mg total) by mouth daily., Disp: 90 tablet, Rfl:  0   Multiple Vitamin (MULTIVITAMIN) capsule, Take 1 capsule by mouth daily. (Patient not taking: Reported on 05/18/2024), Disp: , Rfl:    nirmatrelvir/ritonavir (PAXLOVID , 300/100,) 20 x 150 MG & 10 x 100MG  TBPK, Take 3 tablets by mouth 2 (two) times daily for 5 days. Patient GFR is 60. Take nirmatrelvir (150 mg) two tablets twice daily for 5 days and ritonavir (100 mg) one tablet twice daily for 5 days. (Patient not taking: Reported on 05/18/2024), Disp: 30 tablet, Rfl: 0   predniSONE  (DELTASONE ) 20 MG tablet, Take 2 tablets (40 mg total) by mouth daily with breakfast. (Patient not taking: Reported on 05/18/2024), Disp: 10 tablet, Rfl: 0 [2]  Allergies Allergen Reactions   Penicillins Itching, Swelling and Rash    Did it involve swelling of the face/tongue/throat, SOB, or low BP? No Did it involve sudden or severe rash/hives, skin peeling, or any reaction on the inside of your mouth or nose? Yes Did you need to seek medical attention at a hospital or doctor's office? Yes When did it last happen?  58 years old      If all above answers are NO, may proceed with cephalosporin use.   Permethrin  Hives

## 2024-06-19 ENCOUNTER — Encounter: Payer: Self-pay | Admitting: Nurse Practitioner

## 2024-06-19 ENCOUNTER — Ambulatory Visit: Payer: Self-pay | Admitting: Nurse Practitioner

## 2024-06-19 ENCOUNTER — Ambulatory Visit: Admitting: Nurse Practitioner

## 2024-06-19 VITALS — BP 132/82 | HR 79 | Temp 97.4°F | Ht 67.0 in | Wt 238.4 lb

## 2024-06-19 DIAGNOSIS — M170 Bilateral primary osteoarthritis of knee: Secondary | ICD-10-CM

## 2024-06-19 DIAGNOSIS — J42 Unspecified chronic bronchitis: Secondary | ICD-10-CM

## 2024-06-19 DIAGNOSIS — E559 Vitamin D deficiency, unspecified: Secondary | ICD-10-CM | POA: Insufficient documentation

## 2024-06-19 DIAGNOSIS — I10 Essential (primary) hypertension: Secondary | ICD-10-CM

## 2024-06-19 DIAGNOSIS — Z1211 Encounter for screening for malignant neoplasm of colon: Secondary | ICD-10-CM

## 2024-06-19 DIAGNOSIS — Z1231 Encounter for screening mammogram for malignant neoplasm of breast: Secondary | ICD-10-CM

## 2024-06-19 DIAGNOSIS — Z131 Encounter for screening for diabetes mellitus: Secondary | ICD-10-CM

## 2024-06-19 DIAGNOSIS — Z23 Encounter for immunization: Secondary | ICD-10-CM

## 2024-06-19 DIAGNOSIS — E538 Deficiency of other specified B group vitamins: Secondary | ICD-10-CM | POA: Insufficient documentation

## 2024-06-19 LAB — HEMOGLOBIN A1C: Hgb A1c MFr Bld: 5.9 % (ref 4.6–6.5)

## 2024-06-19 LAB — LIPID PANEL
Cholesterol: 222 mg/dL — ABNORMAL HIGH (ref 28–200)
HDL: 88.9 mg/dL
LDL Cholesterol: 112 mg/dL — ABNORMAL HIGH (ref 10–99)
NonHDL: 132.77
Total CHOL/HDL Ratio: 2
Triglycerides: 102 mg/dL (ref 10.0–149.0)
VLDL: 20.4 mg/dL (ref 0.0–40.0)

## 2024-06-19 LAB — COMPREHENSIVE METABOLIC PANEL WITH GFR
ALT: 15 U/L (ref 3–35)
AST: 21 U/L (ref 5–37)
Albumin: 4.2 g/dL (ref 3.5–5.2)
Alkaline Phosphatase: 69 U/L (ref 39–117)
BUN: 25 mg/dL — ABNORMAL HIGH (ref 6–23)
CO2: 26 meq/L (ref 19–32)
Calcium: 9.6 mg/dL (ref 8.4–10.5)
Chloride: 104 meq/L (ref 96–112)
Creatinine, Ser: 1.03 mg/dL (ref 0.40–1.20)
GFR: 59.94 mL/min — ABNORMAL LOW
Glucose, Bld: 105 mg/dL — ABNORMAL HIGH (ref 70–99)
Potassium: 4.1 meq/L (ref 3.5–5.1)
Sodium: 140 meq/L (ref 135–145)
Total Bilirubin: 0.5 mg/dL (ref 0.2–1.2)
Total Protein: 7.6 g/dL (ref 6.0–8.3)

## 2024-06-19 LAB — CBC WITH DIFFERENTIAL/PLATELET
Basophils Absolute: 0.1 K/uL (ref 0.0–0.1)
Basophils Relative: 0.8 % (ref 0.0–3.0)
Eosinophils Absolute: 0.3 K/uL (ref 0.0–0.7)
Eosinophils Relative: 3.8 % (ref 0.0–5.0)
HCT: 37.9 % (ref 36.0–46.0)
Hemoglobin: 12.4 g/dL (ref 12.0–15.0)
Lymphocytes Relative: 23.2 % (ref 12.0–46.0)
Lymphs Abs: 1.9 K/uL (ref 0.7–4.0)
MCHC: 32.7 g/dL (ref 30.0–36.0)
MCV: 92.4 fl (ref 78.0–100.0)
Monocytes Absolute: 0.5 K/uL (ref 0.1–1.0)
Monocytes Relative: 6.6 % (ref 3.0–12.0)
Neutro Abs: 5.4 K/uL (ref 1.4–7.7)
Neutrophils Relative %: 65.6 % (ref 43.0–77.0)
Platelets: 356 K/uL (ref 150.0–400.0)
RBC: 4.1 Mil/uL (ref 3.87–5.11)
RDW: 13.7 % (ref 11.5–15.5)
WBC: 8.3 K/uL (ref 4.0–10.5)

## 2024-06-19 LAB — VITAMIN B12: Vitamin B-12: 900 pg/mL (ref 211–911)

## 2024-06-19 LAB — VITAMIN D 25 HYDROXY (VIT D DEFICIENCY, FRACTURES): VITD: 74.3 ng/mL (ref 30.00–100.00)

## 2024-06-19 MED ORDER — ALBUTEROL SULFATE HFA 108 (90 BASE) MCG/ACT IN AERS: 2.0000 | INHALATION_SPRAY | RESPIRATORY_TRACT | 1 refills | Status: AC | PRN

## 2024-06-19 MED ORDER — ALBUTEROL SULFATE (2.5 MG/3ML) 0.083% IN NEBU
2.5000 mg | INHALATION_SOLUTION | Freq: Once | RESPIRATORY_TRACT | Status: AC
  Administered 2024-06-19: 2.5 mg via RESPIRATORY_TRACT

## 2024-06-19 MED ORDER — BREZTRI AEROSPHERE 160-9-4.8 MCG/ACT IN AERO: 2.0000 | INHALATION_SPRAY | Freq: Two times a day (BID) | RESPIRATORY_TRACT | 3 refills | Status: AC

## 2024-06-19 NOTE — Progress Notes (Signed)
 "  New Patient Visit  BP 132/82 (BP Location: Left Arm, Cuff Size: Large)   Pulse 79   Temp (!) 97.4 F (36.3 C)   Ht 5' 7 (1.702 m)   Wt 238 lb 6.4 oz (108.1 kg)   LMP 03/21/2011   SpO2 100%   BMI 37.34 kg/m    Subjective:    Patient ID: Cindy Boyer, female    DOB: 1965/09/01, 59 y.o.   MRN: [id number]  CC: Chief Complaint  Patient presents with   Transfer of Care    NP. Est. Care, concerns that she has bronchitis, patient is fasting if labs are needed    HPI: Cindy Boyer is a 59 y.o. female presents to transfer care to a new provider. Introduced to publishing rights manager role and practice setting. All questions answered. Discussed provider/patient relationship and expectations.  Discussed the use of AI scribe software for clinical note transcription with the patient, who gave verbal consent to proceed.  History of Present Illness   Cindy Boyer is a 59 year old female who presents to establish care with respiratory symptoms.  She developed back pain she links to severe coughing during a recent COVID-19 infection. Imaging ruled out blood clot. Pain initially caused significant pain with breathing and interfered with sleep but has improved and now allows her to breathe without discomfort.  She has ongoing respiratory symptoms with morning wheezing, productive cough, and shortness of breath for the last 9 years. A recent CT scan showed no lung abnormalities. She denies current chest pain but feels fatigued, which she relates to her physically demanding job. The summer and heat make her breathing worse. She did quit smoking 9 years ago, but was smoking 1-2 packs per day for 26 years.  Her medical history includes cervical cancer treated with hysterectomy about six years ago and prior left knee replacement and right knee surgery. She is currently pain-free in her knees but has some activity limitations and fear of falling based on prior falls at work.  She has hypertension  treated with lisinopril  40 mg daily. She does not check blood pressure at home. She denies chest pain. She has occasional headaches that she treats with over-the-counter medication.  She takes vitamin B12 and weekly vitamin D . She stopped taking iron despite prior mild anemia.  She quit smoking nine years ago after a two-pack-per-day habit and does not use nicotine now. She drinks a couple of beers after work socially. She works at [employer] and is a mother, grandmother, and great-grandmother.     Depression and Anxiety Screen done:     06/19/2024    8:55 AM 05/07/2024    9:55 AM 05/20/2023   10:09 AM 05/11/2022    9:43 AM 05/11/2022    8:57 AM  Depression screen PHQ 2/9  Decreased Interest 0 0 0 0 0  Down, Depressed, Hopeless 0 0 0 0 0  PHQ - 2 Score 0 0 0 0 0  Altered sleeping 0   1   Tired, decreased energy 1   1   Change in appetite 1   0   Feeling bad or failure about yourself  0   0   Trouble concentrating 0   0   Moving slowly or fidgety/restless 0   0   Suicidal thoughts 0   0   PHQ-9 Score 2   2    Difficult doing work/chores Not difficult at all   Not difficult at all  Data saved with a previous flowsheet row definition      06/19/2024    8:56 AM 05/11/2022    9:43 AM  GAD 7 : Generalized Anxiety Score  Nervous, Anxious, on Edge 1 1  Control/stop worrying 0 1  Worry too much - different things 0 1  Trouble relaxing 1 0  Restless 0 0  Easily annoyed or irritable 1 1  Afraid - awful might happen 0 0  Total GAD 7 Score 3 4  Anxiety Difficulty Not difficult at all Not difficult at all   Past Medical History:  Diagnosis Date   Allergy    Arthritis    Bronchitis    HISTORY AS CHILD, NO PROBLEMS AS ADULT    Cancer (HCC)    Cervical   Clotting disorder    Environmental allergies     Past Surgical History:  Procedure Laterality Date   ABDOMINAL HYSTERECTOMY  2012   BREAST BIOPSY  2018   CERVICAL CONIZATION W/BX  05/14/2011   Procedure: CONIZATION  CERVIX WITH BIOPSY;  Surgeon: Rexene DOROTHA Hoit;  Location: [facility];  Service: Gynecology;  Laterality: N/A;   COLPOSCOPY     JOINT REPLACEMENT     REPLACEMENT TOTAL KNEE Left 2022   SVD     X 1   TOTAL KNEE ARTHROPLASTY Right 2023   WISDOM TOOTH EXTRACTION      Family History  Problem Relation Age of Onset   Breast cancer Mother    Arthritis Mother    Depression Father    Alcohol abuse Father    Depression Brother    Arthritis Maternal Grandmother    Arthritis Brother      Social History[1]  Medications Ordered Prior to Encounter[2]   Review of Systems  Constitutional:  Positive for fatigue. Negative for fever.  HENT: Negative.    Eyes: Negative.   Respiratory:  Positive for shortness of breath and wheezing.   Cardiovascular: Negative.   Gastrointestinal: Negative.   Endocrine: Negative.   Genitourinary: Negative.   Musculoskeletal: Negative.   Skin: Negative.   Neurological:  Positive for headaches. Negative for dizziness.  Psychiatric/Behavioral: Negative.          Objective:    BP 132/82 (BP Location: Left Arm, Cuff Size: Large)   Pulse 79   Temp (!) 97.4 F (36.3 C)   Ht 5' 7 (1.702 m)   Wt 238 lb 6.4 oz (108.1 kg)   LMP 03/21/2011   SpO2 100%   BMI 37.34 kg/m   Wt Readings from Last 3 Encounters:  06/19/24 238 lb 6.4 oz (108.1 kg)  05/18/24 233 lb (105.7 kg)  05/07/24 234 lb (106.1 kg)    BP Readings from Last 3 Encounters:  06/19/24 132/82  05/18/24 134/88  05/13/24 115/75    Physical Exam Vitals and nursing note reviewed.  Constitutional:      General: She is not in acute distress.    Appearance: Normal appearance.  HENT:     Head: Normocephalic and atraumatic.     Right Ear: Tympanic membrane, ear canal and external ear normal.     Left Ear: Tympanic membrane, ear canal and external ear normal.  Eyes:     Conjunctiva/sclera: Conjunctivae normal.  Cardiovascular:     Rate and Rhythm: Normal rate and regular rhythm.     Pulses: Normal  pulses.     Heart sounds: Normal heart sounds.  Pulmonary:     Effort: Pulmonary effort is normal.     Breath  sounds: Wheezing present.  Abdominal:     Palpations: Abdomen is soft.     Tenderness: There is no abdominal tenderness.  Musculoskeletal:        General: Normal range of motion.     Cervical back: Normal range of motion and neck supple.     Right lower leg: No edema.     Left lower leg: No edema.  Lymphadenopathy:     Cervical: No cervical adenopathy.  Skin:    General: Skin is warm and dry.  Neurological:     General: No focal deficit present.     Mental Status: She is alert and oriented to person, place, and time.     Cranial Nerves: No cranial nerve deficit.     Coordination: Coordination normal.     Gait: Gait normal.  Psychiatric:        Mood and Affect: Mood normal.        Behavior: Behavior normal.        Thought Content: Thought content normal.        Judgment: Judgment normal.        Assessment & Plan:   Problem List Items Addressed This Visit       Cardiovascular and Mediastinum   Essential hypertension   Chronic, stable. Continue lisinopril  40mg  daily. Recommend she check her blood pressure at home. Check CMP, CBC, lipid panel today.       Relevant Orders   CBC with Differential/Platelet   Comprehensive metabolic panel with GFR   Lipid panel     Respiratory   Chronic bronchitis (HCC) - Primary   Chronic bronchitis presents with nocturnal wheezing and morning productive cough, suggestive of COPD. CT scan is normal. Spirometry shows severe obstruction with a FEV1/FVC ratio of 0.67 which improved slightly to 0.74 after albuterol . FVC is 40%, improved to 46% after albuterol . With smoking history, concern for COPD. Start breztri  inhaler 2 puffs BID and rinse mouth after using. Continue albuterol  inhaler every 6 hours as needed for shortness of breath or wheezing. Follow-up in 2-3 months.       Relevant Medications    budesonide-glycopyrrolate-formoterol (BREZTRI  AEROSPHERE) 160-9-4.8 MCG/ACT AERO inhaler   albuterol  (VENTOLIN  HFA) 108 (90 Base) MCG/ACT inhaler   Other Relevant Orders   Spirometry with graph     Musculoskeletal and Integument   Primary osteoarthritis of both knees   Bilateral knee osteoarthritis with past surgeries causes no pain but limits activity. She has not had any falls since her surgery.         Other   B12 deficiency   She is taking OTC B12 supplement. Check B12 levels and adjust regimen based on results.       Relevant Orders   Vitamin B12   Vitamin D  deficiency   She is taking vitamin D  once a week. Will check vitamin D  levels and adjust regimen based on results.       Relevant Orders   VITAMIN D  25 Hydroxy (Vit-D Deficiency, Fractures)   Other Visit Diagnoses       Screening for diabetes mellitus       Screen A1c today   Relevant Orders   Hemoglobin A1c     Immunization due       Prevnar 20 given today   Relevant Orders   Pneumococcal conjugate vaccine 20-valent (Completed)     Screen for colon cancer       Cologuard ordered today   Relevant Orders   Cologuard  Encounter for screening mammogram for malignant neoplasm of breast       Mammogram ordered today   Relevant Orders   MM 3D SCREENING MAMMOGRAM BILATERAL BREAST        Follow up plan: Return in about 3 months (around 09/17/2024) for CPE.  Cindy Boyer     [1]  Social History Tobacco Use   Smoking status: Former    Current packs/day: 0.00    Average packs/day: 1 pack/day for 26.0 years (26.0 ttl pk-yrs)    Types: Cigarettes    Start date: 08/21/1991    Quit date: 08/20/2017    Years since quitting: 6.8   Smokeless tobacco: Never  Vaping Use   Vaping status: Never Used  Substance Use Topics   Alcohol use: Yes    Alcohol/week: 14.0 standard drinks of alcohol    Types: 14 Cans of beer per week   Drug use: No  [2]  Current Outpatient Medications on File Prior to Visit   Medication Sig Dispense Refill   cyanocobalamin  (VITAMIN B12) 1000 MCG tablet Take 1 tablet (1,000 mcg total) by mouth daily.     lisinopril  (ZESTRIL ) 40 MG tablet Take 1 tablet (40 mg total) by mouth daily. 90 tablet 0   Multiple Vitamin (MULTIVITAMIN) capsule Take 1 capsule by mouth daily.     VITAMIN D  PO Take by mouth once a week.     No current facility-administered medications on file prior to visit.   "

## 2024-06-19 NOTE — Assessment & Plan Note (Signed)
 Chronic bronchitis presents with nocturnal wheezing and morning productive cough, suggestive of COPD. CT scan is normal. Spirometry shows severe obstruction with a FEV1/FVC ratio of 0.67 which improved slightly to 0.74 after albuterol . FVC is 40%, improved to 46% after albuterol . With smoking history, concern for COPD. Start breztri  inhaler 2 puffs BID and rinse mouth after using. Continue albuterol  inhaler every 6 hours as needed for shortness of breath or wheezing. Follow-up in 2-3 months.

## 2024-06-19 NOTE — Patient Instructions (Signed)
 It was great to see you!  We are checking your labs today and will let you know the results via mychart/phone.   Start breztri  inhaler 2 puffs twice a day   Keep using the albuterol  inhaler as needed every 6 hours for shortness of breath or wheezing   Let's follow-up in 3 months, sooner if you have concerns.  If a referral was placed today, you will be contacted for an appointment. Please note that routine referrals can sometimes take up to 3-4 weeks to process. Please call our office if you haven't heard anything after this time frame.  Take care,  Tinnie Harada, NP

## 2024-06-19 NOTE — Assessment & Plan Note (Signed)
 She is taking vitamin D  once a week. Will check vitamin D  levels and adjust regimen based on results.

## 2024-06-19 NOTE — Assessment & Plan Note (Signed)
 Chronic, stable. Continue lisinopril  40mg  daily. Recommend she check her blood pressure at home. Check CMP, CBC, lipid panel today.

## 2024-06-19 NOTE — Assessment & Plan Note (Signed)
 Bilateral knee osteoarthritis with past surgeries causes no pain but limits activity. She has not had any falls since her surgery.

## 2024-06-19 NOTE — Assessment & Plan Note (Signed)
 She is taking OTC B12 supplement. Check B12 levels and adjust regimen based on results.

## 2024-07-08 ENCOUNTER — Other Ambulatory Visit: Payer: Self-pay | Admitting: Physical Medicine and Rehabilitation

## 2024-07-08 ENCOUNTER — Telehealth: Payer: Self-pay | Admitting: Physical Medicine and Rehabilitation

## 2024-07-08 DIAGNOSIS — M461 Sacroiliitis, not elsewhere classified: Secondary | ICD-10-CM

## 2024-07-08 NOTE — Telephone Encounter (Signed)
 Pt request appointment for another low back injection

## 2024-07-20 ENCOUNTER — Encounter

## 2024-07-30 ENCOUNTER — Encounter: Admitting: Physical Medicine and Rehabilitation

## 2024-09-17 ENCOUNTER — Encounter: Admitting: Nurse Practitioner
